# Patient Record
Sex: Male | Born: 2000 | Race: Black or African American | Hispanic: No | Marital: Single | Smoking: Current some day smoker
Health system: Southern US, Community
[De-identification: ages and names within clinical notes are randomized; demographics above are authoritative.]

## PROBLEM LIST (undated history)

## (undated) DIAGNOSIS — B20 Human immunodeficiency virus [HIV] disease: Secondary | ICD-10-CM

## (undated) DIAGNOSIS — J45909 Unspecified asthma, uncomplicated: Secondary | ICD-10-CM

## (undated) DIAGNOSIS — Z21 Asymptomatic human immunodeficiency virus [HIV] infection status: Secondary | ICD-10-CM

## (undated) HISTORY — DX: Human immunodeficiency virus (HIV) disease: B20

## (undated) HISTORY — DX: Asymptomatic human immunodeficiency virus (hiv) infection status: Z21

---

## 2018-03-10 ENCOUNTER — Encounter (HOSPITAL_COMMUNITY): Payer: Self-pay | Admitting: Emergency Medicine

## 2018-03-10 ENCOUNTER — Other Ambulatory Visit: Payer: Self-pay

## 2018-03-10 ENCOUNTER — Emergency Department (HOSPITAL_COMMUNITY): Payer: BLUE CROSS/BLUE SHIELD

## 2018-03-10 ENCOUNTER — Emergency Department (HOSPITAL_COMMUNITY)
Admission: EM | Admit: 2018-03-10 | Discharge: 2018-03-10 | Disposition: A | Discharge status: Home / Self Care | Payer: BLUE CROSS/BLUE SHIELD | Attending: Emergency Medicine | Admitting: Emergency Medicine

## 2018-03-10 DIAGNOSIS — R05 Cough: Secondary | ICD-10-CM | POA: Diagnosis present

## 2018-03-10 DIAGNOSIS — J4521 Mild intermittent asthma with (acute) exacerbation: Secondary | ICD-10-CM | POA: Diagnosis not present

## 2018-03-10 HISTORY — DX: Unspecified asthma, uncomplicated: J45.909

## 2018-03-10 MED ORDER — PREDNISONE 20 MG PO TABS
60.0000 mg | ORAL_TABLET | Freq: Once | ORAL | Status: AC
  Administered 2018-03-10: 60 mg via ORAL
  Filled 2018-03-10: qty 3

## 2018-03-10 MED ORDER — ALBUTEROL SULFATE HFA 108 (90 BASE) MCG/ACT IN AERS: 2.0000 | INHALATION_SPRAY | RESPIRATORY_TRACT | 1 refills | Status: AC | PRN

## 2018-03-10 MED ORDER — AEROCHAMBER PLUS FLO-VU LARGE MISC
Status: AC
  Administered 2018-03-10: 1
  Filled 2018-03-10: qty 1

## 2018-03-10 MED ORDER — AEROCHAMBER PLUS FLO-VU MEDIUM MISC
1.0000 | Freq: Once | Status: AC
  Administered 2018-03-10: 1
  Filled 2018-03-10: qty 1

## 2018-03-10 MED ORDER — ALBUTEROL SULFATE HFA 108 (90 BASE) MCG/ACT IN AERS
2.0000 | INHALATION_SPRAY | Freq: Once | RESPIRATORY_TRACT | Status: AC
  Administered 2018-03-10: 2 via RESPIRATORY_TRACT
  Filled 2018-03-10: qty 6.7

## 2018-03-10 MED ORDER — PREDNISONE 20 MG PO TABS: 40.0000 mg | ORAL_TABLET | Freq: Every day | ORAL | 0 refills | Status: AC

## 2018-03-10 MED ORDER — IPRATROPIUM-ALBUTEROL 0.5-2.5 (3) MG/3ML IN SOLN
3.0000 mL | Freq: Once | RESPIRATORY_TRACT | Status: AC
  Administered 2018-03-10: 3 mL via RESPIRATORY_TRACT
  Filled 2018-03-10: qty 3

## 2018-03-10 NOTE — Discharge Instructions (Signed)
I have given you a prescription for steroids today.  Some common side effects include feelings of extra energy, feeling warm, increased appetite, and stomach upset.  If you are diabetic your sugars may run higher than usual.  Please do not take your next dose of steroids until tomorrow night.

## 2018-03-10 NOTE — ED Notes (Signed)
Patient verbalizes understanding of discharge instructions. Opportunity for questioning and answers were provided. Armband removed by staff, pt discharged from ED. Pt ambulatory to lobby.  

## 2018-03-10 NOTE — ED Triage Notes (Signed)
Pt reports cold since Tuesday with chest congestion, reports he feels like his asthma is flared up but has run out of inhaler.

## 2018-03-10 NOTE — ED Provider Notes (Signed)
[facility] Note   CSN: [id number] Arrival date & time: 03/10/18  1905     History   Chief Complaint Chief Complaint  Patient presents with  . Cough  . Asthma    HPI Logan Jacobs is a 18 y.o. male with a past medical history of asthma who presents today for evaluation of asthma exacerbation.  He reports that on Tuesday he had chest congestion and feels like his asthma flared up.  He had been doing okay until today when he ran out of his inhaler.  He reports that his inhaler is helping his symptoms when he has it.  He denies any fevers.  He has never had to be hospitalized, intubated or placed on BiPAP for his asthma before.    HPI  Past Medical History:  Diagnosis Date  . Asthma     There are no active problems to display for this patient.   History reviewed. No pertinent surgical history.      Home Medications    Prior to Admission medications   Medication Sig Start Date End Date Taking? Authorizing Provider  albuterol (PROVENTIL HFA;VENTOLIN HFA) 108 (90 Base) MCG/ACT inhaler Inhale 2 puffs into the lungs every 4 (four) hours as needed for wheezing or shortness of breath. 03/10/18   Cristina GongHammond, Mickey Esguerra W, PA-C  predniSONE (DELTASONE) 20 MG tablet Take 2 tablets (40 mg total) by mouth daily for 4 days. 03/10/18 03/14/18  Cristina GongHammond, Channie Bostick W, PA-C    Family History History reviewed. No pertinent family history.  Social History Social History   Tobacco Use  . Smoking status: Never Smoker  . Smokeless tobacco: Never Used  Substance Use Topics  . Alcohol use: Never    Frequency: Never  . Drug use: Never     Allergies   Patient has no allergy information on record.   Review of Systems Review of Systems  Constitutional: Negative for fever.  HENT: Negative for congestion.   Respiratory: Positive for cough, shortness of breath and wheezing. Negative for chest tightness.   All other systems reviewed and are  negative.    Physical Exam Updated Vital Signs BP 130/80 (BP Location: Right Arm)   Pulse 87   Temp 98.4 F (36.9 C) (Oral)   Resp (!) 24   SpO2 97%   Physical Exam Vitals signs and nursing note reviewed.  Constitutional:      General: He is not in acute distress.    Appearance: He is well-developed. He is not toxic-appearing or diaphoretic.  HENT:     Head: Normocephalic and atraumatic.     Right Ear: Tympanic membrane normal.     Left Ear: Tympanic membrane normal.     Nose: Nose normal. No congestion.  Eyes:     General: No scleral icterus.       Right eye: No discharge.        Left eye: No discharge.     Conjunctiva/sclera: Conjunctivae normal.  Neck:     Musculoskeletal: Normal range of motion and neck supple. No neck rigidity.  Cardiovascular:     Rate and Rhythm: Normal rate and regular rhythm.     Heart sounds: Normal heart sounds. No murmur.  Pulmonary:     Effort: Pulmonary effort is normal. No respiratory distress.     Breath sounds: No stridor. Wheezing (Diffuse mild wheezes/tightness throughout bilaterally.) present.  Abdominal:     General: There is no distension.  Musculoskeletal:  General: No deformity.  Lymphadenopathy:     Cervical: No cervical adenopathy.  Skin:    General: Skin is warm and dry.  Neurological:     Mental Status: He is alert.     Motor: No abnormal muscle tone.  Psychiatric:        Mood and Affect: Mood normal.        Behavior: Behavior normal.      ED Treatments / Results  Labs (all labs ordered are listed, but only abnormal results are displayed) Labs Reviewed - No data to display  EKG None  Radiology Dg Chest 2 View  Result Date: 03/10/2018 CLINICAL DATA:  Cough, asthma and dyspnea today. EXAM: CHEST - 2 VIEW COMPARISON:  None. FINDINGS: The heart size and mediastinal contours are within normal limits. Both lungs are clear. The visualized skeletal structures are unremarkable. IMPRESSION: No active  cardiopulmonary disease. Electronically Signed   By: Tollie Eth M.D.   On: 03/10/2018 20:27    Procedures Procedures (including critical care time)  Medications Ordered in ED Medications  predniSONE (DELTASONE) tablet 60 mg (has no administration in time range)  albuterol (PROVENTIL HFA;VENTOLIN HFA) 108 (90 Base) MCG/ACT inhaler 2 puff (has no administration in time range)  AEROCHAMBER PLUS FLO-VU MEDIUM MISC 1 each (has no administration in time range)  ipratropium-albuterol (DUONEB) 0.5-2.5 (3) MG/3ML nebulizer solution 3 mL (3 mLs Nebulization Given 03/10/18 2033)     Initial Impression / Assessment and Plan / ED Course  I have reviewed the triage vital signs and the nursing notes.  Pertinent labs & imaging results that were available during my care of the patient were reviewed by me and considered in my medical decision making (see chart for details).  Clinical Course as of Mar 10 2112  Caleen Essex Mar 10, 2018  2108 Patient reevaluated, lungs are clear to auscultation bilaterally and he reports he feels like his breathing is much better.   [EH]    Clinical Course User Index [EH] Cristina Gong, PA-C   Patient presents today for evaluation of asthma exacerbation.  For the past 3 days he has had worsening asthma symptoms.  He ran out of his albuterol inhaler today.  Initially lungs had slight wheezes and decreased air movement bilaterally.  He was treated with a DuoNeb after which he had full resolution of symptoms.  He is given an inhaler and a spacer while in the department today.  He is also given a dose of prednisone.  He is given prescription for additional albuterol inhaler and 4 more days of prednisone given the frequency in which he is using his rescue inhaler.  Return precautions were discussed with patient who states their understanding.  At the time of discharge patient denied any unaddressed complaints or concerns.  Patient is agreeable for discharge home.   Final  Clinical Impressions(s) / ED Diagnoses   Final diagnoses:  Mild intermittent asthma with exacerbation    ED Discharge Orders         Ordered    albuterol (PROVENTIL HFA;VENTOLIN HFA) 108 (90 Base) MCG/ACT inhaler  Every 4 hours PRN     03/10/18 2110    predniSONE (DELTASONE) 20 MG tablet  Daily     03/10/18 2110           Norman Clay 03/10/18 2114    Marily Memos, MD 03/10/18 2252

## 2019-07-06 ENCOUNTER — Ambulatory Visit (HOSPITAL_COMMUNITY)
Admission: EM | Admit: 2019-07-06 | Discharge: 2019-07-06 | Disposition: A | Discharge status: Home / Self Care | Payer: BLUE CROSS/BLUE SHIELD | Attending: Internal Medicine | Admitting: Internal Medicine

## 2019-07-06 ENCOUNTER — Other Ambulatory Visit: Payer: Self-pay

## 2019-07-06 ENCOUNTER — Encounter (HOSPITAL_COMMUNITY): Payer: Self-pay

## 2019-07-06 DIAGNOSIS — Z202 Contact with and (suspected) exposure to infections with a predominantly sexual mode of transmission: Secondary | ICD-10-CM | POA: Diagnosis not present

## 2019-07-06 NOTE — ED Triage Notes (Signed)
Pt wants STI testing, because sexual partner is having penile discharge. Pt denies symptoms at this time.

## 2019-07-06 NOTE — ED Provider Notes (Signed)
[place]    CSN: [id number] Arrival date & time: 07/06/19  1409      History   Chief Complaint Chief Complaint  Patient presents with  . STI testing    HPI Claris Guymon is a 19 y.o. male anal receptive homosexual male comes to the urgent care requesting testing after his sexual partner complained of penile discharge.  Patient has no penile discharge.  No dysuria, urgency or frequency.  Patient denies any rectal ulcers/discharge.  No sore throat.  No fever or chills.   HPI  Past Medical History:  Diagnosis Date  . Asthma     There are no problems to display for this patient.   History reviewed. No pertinent surgical history.     Home Medications    Prior to Admission medications   Medication Sig Start Date End Date Taking? Authorizing Provider  albuterol (PROVENTIL HFA;VENTOLIN HFA) 108 (90 Base) MCG/ACT inhaler Inhale 2 puffs into the lungs every 4 (four) hours as needed for wheezing or shortness of breath. 03/10/18   Lorin Glass, PA-C    Family History Family History  Problem Relation Age of Onset  . Hypertension Mother   . Healthy Father     Social History Social History   Tobacco Use  . Smoking status: Never Smoker  . Smokeless tobacco: Never Used  Substance Use Topics  . Alcohol use: Yes    Comment: occ  . Drug use: Never     Allergies   Patient has no known allergies.   Review of Systems Review of Systems  Gastrointestinal: Negative.   Genitourinary: Negative.   Musculoskeletal: Negative.   Neurological: Negative.      Physical Exam Triage Vital Signs ED Triage Vitals  Enc Vitals Group     BP 07/06/19 1429 (!) 149/85     Pulse Rate 07/06/19 1429 79     Resp 07/06/19 1429 16     Temp 07/06/19 1429 (!) 97.5 F (36.4 C)     Temp Source 07/06/19 1429 Oral     SpO2 07/06/19 1429 100 %     Weight 07/06/19 1431 150 lb (68 kg)     Height 07/06/19 1431 5\' 11"  (1.803 m)     Head Circumference --      Peak Flow  --      Pain Score 07/06/19 1431 0     Pain Loc --      Pain Edu? --      Excl. in [place]? --    No data found.  Updated Vital Signs BP (!) 149/85   Pulse 79   Temp (!) 97.5 F (36.4 C) (Oral)   Resp 16   Ht 5\' 11"  (1.803 m)   Wt 68 kg   SpO2 100%   BMI 20.92 kg/m   Visual Acuity Right Eye Distance:   Left Eye Distance:   Bilateral Distance:    Right Eye Near:   Left Eye Near:    Bilateral Near:     Physical Exam Constitutional:      General: He is not in acute distress.    Appearance: Normal appearance. He is not ill-appearing.  Cardiovascular:     Rate and Rhythm: Normal rate and regular rhythm.  Pulmonary:     Effort: Pulmonary effort is normal.     Breath sounds: Normal breath sounds.  Abdominal:     General: Bowel sounds are normal.     Palpations: Abdomen is soft.  Genitourinary:  Penis: Normal.      Testes: Normal.     Rectum: Normal.  Musculoskeletal:        General: Normal range of motion.  Skin:    General: Skin is warm and dry.  Neurological:     Mental Status: He is alert.      UC Treatments / Results  Labs (all labs ordered are listed, but only abnormal results are displayed) Labs Reviewed  RPR  HIV ANTIBODY (ROUTINE TESTING W REFLEX)  CYTOLOGY, (ORAL, ANAL, URETHRAL) ANCILLARY ONLY  CYTOLOGY, (ORAL, ANAL, URETHRAL) ANCILLARY ONLY    EKG   Radiology No results found.  Procedures Procedures (including critical care time)  Medications Ordered in UC Medications - No data to display  Initial Impression / Assessment and Plan / UC Course  I have reviewed the triage vital signs and the nursing notes.  Pertinent labs & imaging results that were available during my care of the patient were reviewed by me and considered in my medical decision making (see chart for details).     1.  Possible STD exposure: Rectal and penile swabs for GC/chlamydia/trichomonas HIV RPR Safe sex practices We will wait for test results to inform  treatment. Final Clinical Impressions(s) / UC Diagnoses   Final diagnoses:  Exposure to STD   Discharge Instructions   None    ED Prescriptions    None     PDMP not reviewed this encounter.   Merrilee Jansky, MD 07/06/19 [phone]

## 2019-07-07 LAB — HIV ANTIBODY (ROUTINE TESTING W REFLEX): HIV Screen 4th Generation wRfx: REACTIVE — AB

## 2019-07-07 LAB — RPR: RPR Ser Ql: NONREACTIVE

## 2019-07-10 ENCOUNTER — Telehealth: Payer: Self-pay | Admitting: *Deleted

## 2019-07-10 DIAGNOSIS — B20 Human immunodeficiency virus [HIV] disease: Secondary | ICD-10-CM

## 2019-07-10 DIAGNOSIS — Z79899 Other long term (current) drug therapy: Secondary | ICD-10-CM

## 2019-07-10 DIAGNOSIS — Z113 Encounter for screening for infections with a predominantly sexual mode of transmission: Secondary | ICD-10-CM

## 2019-07-10 LAB — HIV-1/2 AB - DIFFERENTIATION
HIV 1 Ab: POSITIVE — AB
HIV 2 Ab: NEGATIVE

## 2019-07-10 NOTE — Telephone Encounter (Signed)
Referral sent to [facility]. Andree Coss, RN

## 2019-07-10 NOTE — Telephone Encounter (Signed)
-----   Message from Veryl Speak, FNP sent at 07/10/2019  3:50 PM EDT ----- Regarding: New HIV Newly diagnosed HIV-1 confirmed. Please notify [facility]. Not sure he is aware of diagnosis.

## 2019-07-11 ENCOUNTER — Telehealth (HOSPITAL_COMMUNITY): Payer: Self-pay

## 2019-07-11 LAB — CYTOLOGY, (ORAL, ANAL, URETHRAL) ANCILLARY ONLY
Chlamydia: NEGATIVE
Chlamydia: NEGATIVE
Comment: NEGATIVE
Comment: NEGATIVE
Comment: NEGATIVE
Comment: NEGATIVE
Comment: NORMAL
Comment: NORMAL
Neisseria Gonorrhea: NEGATIVE
Neisseria Gonorrhea: NEGATIVE
Trichomonas: NEGATIVE
Trichomonas: NEGATIVE

## 2019-07-11 NOTE — Addendum Note (Signed)
Addended by: Andree Coss on: 07/11/2019 04:39 PM   Modules accepted: Orders

## 2019-07-11 NOTE — Telephone Encounter (Signed)
Selena Batten at [facility] department received call from patient. He found his positive test result on mychart, and contacted the health department himself for connection to care.  Per Selena Batten, patient is ready to start at [facility]. Andree Coss, RN

## 2019-07-12 ENCOUNTER — Ambulatory Visit: Payer: Self-pay

## 2019-07-12 ENCOUNTER — Other Ambulatory Visit: Payer: Self-pay

## 2019-07-12 ENCOUNTER — Telehealth: Payer: Self-pay | Admitting: Pharmacy Technician

## 2019-07-12 DIAGNOSIS — Z79899 Other long term (current) drug therapy: Secondary | ICD-10-CM

## 2019-07-12 DIAGNOSIS — B20 Human immunodeficiency virus [HIV] disease: Secondary | ICD-10-CM

## 2019-07-12 DIAGNOSIS — Z113 Encounter for screening for infections with a predominantly sexual mode of transmission: Secondary | ICD-10-CM

## 2019-07-12 NOTE — Telephone Encounter (Addendum)
[facility] Patient Advocate Encounter   Patient has been approved until 07/11/2020 for [employer] Advancing Access Patient Assistance Program. He is enrolled adap eligible and will need a denial letter within 90-days of 07/14/2019 to remain in the program.   I have spoken with the patient and they will address pharmacy coverage on his 07/31/2019 appointment with Tammy Sours and can use this voucher at that time. Currently a college student and not under his own insurance.   The billing information is:   Patient knows to call the office with questions or concerns. [employer] application has been sent 06/03 and will more than likely get denied for income and household size. He prefers pickup from [employer].  Beulah Gandy, CPhT Specialty Pharmacy Patient [facility] for Infectious Disease Phone: [phone] Fax: [phone] 07/12/2019 2:30 PM

## 2019-07-13 ENCOUNTER — Encounter: Payer: Self-pay | Admitting: Family

## 2019-07-13 LAB — URINALYSIS
Bilirubin Urine: NEGATIVE
Glucose, UA: NEGATIVE
Hgb urine dipstick: NEGATIVE
Ketones, ur: NEGATIVE
Leukocytes,Ua: NEGATIVE
Nitrite: NEGATIVE
Protein, ur: NEGATIVE
Specific Gravity, Urine: 1.022 (ref 1.001–1.03)
pH: 7 (ref 5.0–8.0)

## 2019-07-13 LAB — URINE CYTOLOGY ANCILLARY ONLY
Chlamydia: NEGATIVE
Comment: NEGATIVE
Comment: NORMAL
Neisseria Gonorrhea: NEGATIVE

## 2019-07-13 LAB — T-HELPER CELL (CD4) - (RCID CLINIC ONLY)
CD4 % Helper T Cell: 29 % — ABNORMAL LOW (ref 33–65)
CD4 T Cell Abs: 626 /uL (ref 400–1790)

## 2019-07-24 LAB — HEPATITIS B SURFACE ANTIGEN: Hepatitis B Surface Ag: NONREACTIVE

## 2019-07-24 LAB — COMPLETE METABOLIC PANEL WITH GFR
AG Ratio: 1.6 (calc) (ref 1.0–2.5)
ALT: 23 U/L (ref 9–46)
AST: 30 U/L (ref 10–40)
Albumin: 4.5 g/dL (ref 3.6–5.1)
Alkaline phosphatase (APISO): 66 U/L (ref 36–130)
BUN: 16 mg/dL (ref 7–25)
CO2: 25 mmol/L (ref 20–32)
Calcium: 10.2 mg/dL (ref 8.6–10.3)
Chloride: 105 mmol/L (ref 98–110)
Creat: 0.9 mg/dL (ref 0.60–1.35)
GFR, Est African American: 142 mL/min/{1.73_m2} (ref >=60)
GFR, Est Non African American: 123 mL/min/{1.73_m2} (ref >=60)
Globulin: 2.9 g/dL (calc) (ref 1.9–3.7)
Glucose, Bld: 83 mg/dL (ref 65–99)
Potassium: 4 mmol/L (ref 3.5–5.3)
Sodium: 140 mmol/L (ref 135–146)
Total Bilirubin: 0.6 mg/dL (ref 0.2–1.2)
Total Protein: 7.4 g/dL (ref 6.1–8.1)

## 2019-07-24 LAB — CBC WITH DIFFERENTIAL/PLATELET
Absolute Monocytes: 442 cells/uL (ref 200–950)
Basophils Absolute: 19 cells/uL (ref 0–200)
Basophils Relative: 0.4 %
Eosinophils Absolute: 141 cells/uL (ref 15–500)
Eosinophils Relative: 3 %
HCT: 41.1 % (ref 38.5–50.0)
Hemoglobin: 14.3 g/dL (ref 13.2–17.1)
Lymphs Abs: 2143 cells/uL (ref 850–3900)
MCH: 31.2 pg (ref 27.0–33.0)
MCHC: 34.8 g/dL (ref 32.0–36.0)
MCV: 89.7 fL (ref 80.0–100.0)
MPV: 11 fL (ref 7.5–12.5)
Monocytes Relative: 9.4 %
Neutro Abs: 1955 cells/uL (ref 1500–7800)
Neutrophils Relative %: 41.6 %
Platelets: 230 10*3/uL (ref 140–400)
RBC: 4.58 10*6/uL (ref 4.20–5.80)
RDW: 13.1 % (ref 11.0–15.0)
Total Lymphocyte: 45.6 %
WBC: 4.7 10*3/uL (ref 3.8–10.8)

## 2019-07-24 LAB — LIPID PANEL
Cholesterol: 134 mg/dL (ref <200)
HDL: 72 mg/dL (ref >=40)
LDL Cholesterol (Calc): 44 mg/dL (calc)
Non-HDL Cholesterol (Calc): 62 mg/dL (calc) (ref <130)
Total CHOL/HDL Ratio: 1.9 (calc) (ref <5.0)
Triglycerides: 101 mg/dL (ref <150)

## 2019-07-24 LAB — HEPATITIS A ANTIBODY, TOTAL: Hepatitis A AB,Total: REACTIVE — AB

## 2019-07-24 LAB — QUANTIFERON-TB GOLD PLUS
Mitogen-NIL: 9.95 IU/mL
NIL: 0.07 IU/mL
QuantiFERON-TB Gold Plus: NEGATIVE
TB1-NIL: 0 IU/mL
TB2-NIL: 0.01 IU/mL

## 2019-07-24 LAB — HIV ANTIBODY (ROUTINE TESTING W REFLEX): HIV 1&2 Ab, 4th Generation: REACTIVE — AB

## 2019-07-24 LAB — RPR: RPR Ser Ql: NONREACTIVE

## 2019-07-24 LAB — HEPATITIS C ANTIBODY
Hepatitis C Ab: NONREACTIVE
SIGNAL TO CUT-OFF: 0.03 (ref <1.00)

## 2019-07-24 LAB — HIV-1 RNA ULTRAQUANT REFLEX TO GENTYP+
HIV 1 RNA Quant: 642000 copies/mL — ABNORMAL HIGH
HIV-1 RNA Quant, Log: 5.81 Log copies/mL — ABNORMAL HIGH

## 2019-07-24 LAB — HLA B*5701: HLA-B*5701 w/rflx HLA-B High: NEGATIVE

## 2019-07-24 LAB — HIV-1 GENOTYPE: HIV-1 Genotype: DETECTED — AB

## 2019-07-24 LAB — HEPATITIS B CORE ANTIBODY, TOTAL: Hep B Core Total Ab: NONREACTIVE

## 2019-07-24 LAB — HIV-1/2 AB - DIFFERENTIATION
HIV-1 antibody: POSITIVE — AB
HIV-2 Ab: NEGATIVE

## 2019-07-24 LAB — HEPATITIS B SURFACE ANTIBODY,QUALITATIVE: Hep B S Ab: NONREACTIVE

## 2019-07-27 ENCOUNTER — Telehealth: Payer: Self-pay | Admitting: Pharmacy Technician

## 2019-07-27 NOTE — Telephone Encounter (Addendum)
[facility] Patient Product/process development scientist completed.    The patient is insured through [facility] and has a $3600 copay.  Patient does not want parents to know he is HIV positive and does not want medication billed through their insurance.  I have ADAP denial letter to appeal to patient assistance.  Netty Starring. Dimas Aguas CPhT Specialty Pharmacy Patient [facility] for Infectious Disease Phone: [phone] Fax:  [phone]

## 2019-07-31 ENCOUNTER — Ambulatory Visit (INDEPENDENT_AMBULATORY_CARE_PROVIDER_SITE_OTHER): Payer: Self-pay | Admitting: Family

## 2019-07-31 ENCOUNTER — Encounter: Payer: Self-pay | Admitting: Family

## 2019-07-31 ENCOUNTER — Other Ambulatory Visit: Payer: Self-pay

## 2019-07-31 ENCOUNTER — Ambulatory Visit (INDEPENDENT_AMBULATORY_CARE_PROVIDER_SITE_OTHER): Payer: Self-pay | Admitting: Pharmacist

## 2019-07-31 VITALS — BP 170/95 | HR 63 | Temp 97.6°F | Wt 152.0 lb

## 2019-07-31 DIAGNOSIS — B20 Human immunodeficiency virus [HIV] disease: Secondary | ICD-10-CM

## 2019-07-31 MED ORDER — BICTEGRAVIR-EMTRICITAB-TENOFOV 50-200-25 MG PO TABS: 1.0000 | ORAL_TABLET | Freq: Every day | ORAL | 2 refills | Status: DC

## 2019-07-31 NOTE — Telephone Encounter (Signed)
Needed ADAP denial letter has been faxed.  Netty Starring. Dimas Aguas CPhT Specialty Pharmacy Patient [facility] for Infectious Disease Phone: [phone] Fax:  [phone]

## 2019-07-31 NOTE — Progress Notes (Signed)
HPI: Logan Jacobs is a 19 y.o. male who presents to the [facility] clinic today to initiate care for a newly diagnosed HIV infection.  There are no problems to display for this patient.   Patient's Medications  New Prescriptions   No medications on file  Previous Medications   ALBUTEROL (PROVENTIL HFA;VENTOLIN HFA) 108 (90 BASE) MCG/ACT INHALER    Inhale 2 puffs into the lungs every 4 (four) hours as needed for wheezing or shortness of breath.  Modified Medications   No medications on file  Discontinued Medications   No medications on file    Allergies: No Known Allergies  Past Medical History: Past Medical History:  Diagnosis Date  . Asthma   . HIV (human immunodeficiency virus infection) (HCC)     Social History: Social History   Socioeconomic History  . Marital status: Significant Other    Spouse name: Not on file  . Number of children: 0  . Years of education: 86  . Highest education level: Not on file  Occupational History  . Not on file  Tobacco Use  . Smoking status: Never Smoker  . Smokeless tobacco: Never Used  Vaping Use  . Vaping Use: Never used  Substance and Sexual Activity  . Alcohol use: Yes    Comment: occ  . Drug use: Never  . Sexual activity: Not on file  Other Topics Concern  . Not on file  Social History Narrative  . Not on file   Social Determinants of Health   Financial Resource Strain:   . Difficulty of Paying Living Expenses:   Food Insecurity:   . Worried About Programme researcher, broadcasting/film/video in the Last Year:   . Barista in the Last Year:   Transportation Needs:   . Freight forwarder (Medical):   Marland Kitchen Lack of Transportation (Non-Medical):   Physical Activity:   . Days of Exercise per Week:   . Minutes of Exercise per Session:   Stress:   . Feeling of Stress :   Social Connections:   . Frequency of Communication with Friends and Family:   . Frequency of Social Gatherings with Friends and Family:   . Attends Religious  Services:   . Active Member of Clubs or Organizations:   . Attends Banker Meetings:   Marland Kitchen Marital Status:     Labs: Lab Results  Component Value Date   HIV1RNAQUANT 642,000 (H) 07/12/2019   CD4TABS 626 07/12/2019    RPR and STI Lab Results  Component Value Date   LABRPR NON-REACTIVE 07/12/2019   LABRPR NON REACTIVE 07/06/2019    STI Results GC CT  07/12/2019 Negative Negative  07/06/2019 Negative Negative  07/06/2019 Negative Negative    Hepatitis B Lab Results  Component Value Date   HEPBSAB NON-REACTIVE 07/12/2019   HEPBSAG NON-REACTIVE 07/12/2019   HEPBCAB NON-REACTIVE 07/12/2019   Hepatitis C Lab Results  Component Value Date   HEPCAB NON-REACTIVE 07/12/2019   Hepatitis A Lab Results  Component Value Date   HAV REACTIVE (A) 07/12/2019   Lipids: Lab Results  Component Value Date   CHOL 134 07/12/2019   TRIG 101 07/12/2019   HDL 72 07/12/2019   CHOLHDL 1.9 07/12/2019   LDLCALC 44 07/12/2019    Current HIV Regimen: Treatment naive  Assessment: Logan Jacobs is here today to initiate care with Tammy Sours for his newly diagnosed HIV infection.  Logan Jacobs is treatment naive with an initial HIV viral load of 642,000 and a CD4 count of  626.  There were a few resistance mutations found on initial genotype, but none that are concerning or confer any resistance to any medications. Will start patient on [place].  I spoke with [place] about [place] before he starts this medication. This is a one pill once a day medication that can be taken with or without food. It is generally very well tolerated, some reported side effects include headache, nausea, diarrhea or dizziness but these typically resolve quickly if they are experienced. He does not take any multivitamins or medications that are concerning for interaction with Biktarvy. Lastly, we spoke about the importance of adherence and that it is the most important factor in treating his HIV infection effectively  which he understands.  He is under his parents insurance but does not wish to use this because he does not want his parents to know he has HIV. We are trying to get him ADAP approved but in the meantime we will get him medication through advancing access and I provided him with the information for the pharmacy. I also provided him with Cassie's card and he will call with any medication related issues/questions.  Plan: - Start [place] - Follow up with Gershon Crane, PharmD PGY2 Infectious Disease Pharmacy Resident  [facility] Infect[facility] Disease 07/31/2019, 9:38 AM

## 2019-07-31 NOTE — Assessment & Plan Note (Signed)
Logan Jacobs is a 18-year-old male with newly diagnosed HIV-1 disease with initial viral load of 642,000 and CD4 count of 626.  Genotype detected with no significant resistance patterns consistent with wild-type virus.  Risk factors for acquiring HIV is sexual contact through MSM.  Does not recall any symptoms associated with acute retroviral syndrome and no history of opportunistic infection.  We reviewed lab work and discussed the plan of care.  Also discussed the pathogenesis, transmission, prevention, risks if left untreated, and treatment options for HIV.  He met with pharmacy staff today to review medication.  Plan to start Biktarvy 1 tablet daily.  Follow-up in 1 month or sooner if needed with lab work on the same day. 

## 2019-07-31 NOTE — Progress Notes (Signed)
Subjective:    Patient ID: Logan Jacobs, male    DOB: January 10, 2001, 19 y.o.   MRN: [id number]  Chief Complaint  Patient presents with  . Follow-up    HPI:  Logan Jacobs is a 19 y.o. male with no significant previous medical history presenting today for initial evaluation and treatment of newly diagnosed HIV-1 disease.  Logan Jacobs was recently diagnosed with HIV-1 disease on 07/06/2019 when seen in urgent care for concerns of STI and noted to have positive HIV testing. Risk factors for acquiring HIV includes sexual contact through MSM. Does not recall any flulike symptoms or fevers/chills. Has had a rash on his back initially which is slowly improving. He is currently in a relationship with a partner who is HIV negative. 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  Logan Jacobs initial clinic blood work completed on 07/12/2019 with viral load of 642,000 and CD4 count of 626. Genotype detected with no significant resistance patterns present indicating wild-type virus. HLA B5701 and QuantiFERON gold negative. STI testing was negative for gonorrhea, chlamydia, and syphilis.  Logan Jacobs will have financial assistance through advancing access.  Denies feelings of being down, depressed, or hopeless recently.  No recreational or illicit drug use or tobacco use.  He does drink alcohol on occasion.    No Known Allergies    Outpatient Medications Prior to Visit  Medication Sig Dispense Refill  . albuterol (PROVENTIL HFA;VENTOLIN HFA) 108 (90 Base) MCG/ACT inhaler Inhale 2 puffs into the lungs every 4 (four) hours as needed for wheezing or shortness of breath. (Patient not taking: Reported on 07/31/2019) 1 Inhaler 1   No facility-administered medications prior to visit.     Past Medical History:  Diagnosis Date  . Asthma   . HIV (human immunodeficiency virus infection) ([place])       History  reviewed. No pertinent surgical history.    Family History  Problem Relation Age of Onset  . Hypertension Mother   . Healthy Father       Social History   Socioeconomic History  . Marital status: Significant Other    Spouse name: Not on file  . Number of children: 0  . Years of education: 84  . Highest education level: Not on file  Occupational History  . Not on file  Tobacco Use  . Smoking status: Never Smoker  . Smokeless tobacco: Never Used  Vaping Use  . Vaping Use: Never used  Substance and Sexual Activity  . Alcohol use: Yes    Comment: occ  . Drug use: Never  . Sexual activity: Not on file  Other Topics Concern  . Not on file  Social History Narrative  . Not on file   Social Determinants of Health   Financial Resource Strain:   . Difficulty of Paying Living Expenses:   Food Insecurity:   . Worried About Charity fundraiser in the Last Year:   . Arboriculturist in the Last Year:   Transportation Needs:   . Film/video editor (Medical):   Marland Kitchen Lack of Transportation (Non-Medical):   Physical Activity:   . Days of Exercise per Week:   . Minutes of Exercise per Session:   Stress:   . Feeling of Stress :   Social Connections:   . Frequency of Communication with Friends and Family:   . Frequency of Social Gatherings with Friends and Family:   .  Attends Religious Services:   . Active Member of Clubs or Organizations:   . Attends Archivist Meetings:   Marland Kitchen Marital Status:   Intimate Partner Violence:   . Fear of Current or Ex-Partner:   . Emotionally Abused:   Marland Kitchen Physically Abused:   . Sexually Abused:       Review of Systems  Constitutional: Negative for appetite change, chills, fatigue, fever and unexpected weight change.  Eyes: Negative for visual disturbance.  Respiratory: Negative for cough, chest tightness, shortness of breath and wheezing.   Cardiovascular: Negative for chest pain and leg swelling.  Gastrointestinal: Negative for  abdominal pain, constipation, diarrhea, nausea and vomiting.  Genitourinary: Negative for dysuria, flank pain, frequency, genital sores, hematuria and urgency.  Skin: Negative for rash.  Allergic/Immunologic: Negative for immunocompromised state.  Neurological: Negative for dizziness and headaches.       Objective:    BP (!) 170/95   Pulse 63   Temp 97.6 F (36.4 C) (Oral)   Wt 152 lb (68.9 kg)   BMI 21.20 kg/m  Nursing note and vital signs reviewed.  Physical Exam Constitutional:      General: He is not in acute distress.    Appearance: He is well-developed.  Eyes:     Conjunctiva/sclera: Conjunctivae normal.  Cardiovascular:     Rate and Rhythm: Normal rate and regular rhythm.     Heart sounds: Normal heart sounds. No murmur heard.  No friction rub. No gallop.   Pulmonary:     Effort: Pulmonary effort is normal. No respiratory distress.     Breath sounds: Normal breath sounds. No wheezing or rales.  Chest:     Chest wall: No tenderness.  Abdominal:     General: Bowel sounds are normal.     Palpations: Abdomen is soft.     Tenderness: There is no abdominal tenderness.  Musculoskeletal:     Cervical back: Neck supple.  Lymphadenopathy:     Cervical: No cervical adenopathy.  Skin:    General: Skin is warm and dry.     Findings: No rash.  Neurological:     Mental Status: He is alert and oriented to person, place, and time.  Psychiatric:        Behavior: Behavior normal.        Thought Content: Thought content normal.        Judgment: Judgment normal.         Assessment & Plan:   Patient Active Problem List   Diagnosis Date Noted  . Human immunodeficiency virus (HIV) disease ([place]) 07/31/2019     Problem List Items Addressed This Visit      Other   Human immunodeficiency virus (HIV) disease ([place]) - Primary    Logan Jacobs is a 19 year old male with newly diagnosed HIV-1 disease with initial viral load of 642,000 and CD4 count of 626.  Genotype detected  with no significant resistance patterns consistent with wild-type virus.  Risk factors for acquiring HIV is sexual contact through MSM.  Does not recall any symptoms associated with acute retroviral syndrome and no history of opportunistic infection.  We reviewed lab work and discussed the plan of care.  Also discussed the pathogenesis, transmission, prevention, risks if left untreated, and treatment options for HIV.  He met with pharmacy staff today to review medication.  Plan to start Biktarvy 1 tablet daily.  Follow-up in 1 month or sooner if needed with lab work on the same day.  Relevant Medications   bictegravir-emtricitabine-tenofovir AF (BIKTARVY) 50-200-25 MG TABS tablet       I am having Oda Mathers start on bictegravir-emtricitabine-tenofovir AF. I am also having him maintain his albuterol.   Meds ordered this encounter  Medications  . bictegravir-emtricitabine-tenofovir AF (BIKTARVY) 50-200-25 MG TABS tablet    Sig: Take 1 tablet by mouth daily.    Dispense:  30 tablet    Refill:  2    Order Specific Question:   Supervising Provider    Answer:   Carlyle Basques [[id number]]     Follow-up: Return in about 1 month (around 08/30/2019), or if symptoms worsen or fail to improve.    Terri Piedra, MSN, FNP-C Nurse Practitioner [facility] [place] number: [phone]

## 2019-07-31 NOTE — Patient Instructions (Signed)
Nice to see you.  We will get you started on Biktarvy.   Let us know if you have any questions.   We will plan to follow up in 1 month or sooner if needed with lab work on the same day.  Have a great day and stay safe!

## 2019-08-29 ENCOUNTER — Other Ambulatory Visit: Payer: Self-pay

## 2019-08-29 ENCOUNTER — Ambulatory Visit (INDEPENDENT_AMBULATORY_CARE_PROVIDER_SITE_OTHER): Payer: BLUE CROSS/BLUE SHIELD | Admitting: Family

## 2019-08-29 ENCOUNTER — Encounter: Payer: Self-pay | Admitting: Family

## 2019-08-29 VITALS — BP 142/76 | HR 66 | Temp 97.7°F | Wt 154.0 lb

## 2019-08-29 DIAGNOSIS — B20 Human immunodeficiency virus [HIV] disease: Secondary | ICD-10-CM

## 2019-08-29 MED ORDER — BICTEGRAVIR-EMTRICITAB-TENOFOV 50-200-25 MG PO TABS: 1.0000 | ORAL_TABLET | Freq: Every day | ORAL | 2 refills | Status: DC

## 2019-08-29 NOTE — Patient Instructions (Signed)
Nice to see you.  We will check your lab work today.  Continue take your [place] daily as prescribed.  Refills have been sent to the pharmacy.  Plan for follow-up in 6 weeks or sooner if needed with lab work 1 to 2 weeks prior to appointment or on same day.  Have a great day and stay safe!

## 2019-08-29 NOTE — Progress Notes (Signed)
Subjective:    Patient ID: Logan Jacobs, male    DOB: Aug 25, 2000, 19 y.o.   MRN: [id number]  Chief Complaint  Patient presents with  . Follow-up    B20     HPI:  Logan Jacobs is a 19 y.o. male with HIV disease who was last seen in the office on 07/31/2019 with newly diagnosed HIV disease with initial CD4 count of 626 and a viral load of 642,000.  He was started on Biktarvy.  Here today for routine follow-up.  Logan Jacobs has been taking his Biktarvy daily as prescribed and has noticed a small acne type breakout that comes and goes that is new since starting the [place]. Overall feeling well with no new concerns/complaints. Denies fevers, chills, night sweats, headaches, changes in vision, neck pain/stiffness, nausea, diarrhea, vomiting, and lesions.  Logan Jacobs has no problems obtaining his medication from the pharmacy and remains covered through [facility].  No feelings of being down, depressed, or hopeless recently.  Smokes black and milds on occasion with no recreational or illicit drug use and alcohol consumption socially.  Declines condoms today.  Interested in receiving the Covid vaccination.   No Known Allergies    Outpatient Medications Prior to Visit  Medication Sig Dispense Refill  . albuterol (PROVENTIL HFA;VENTOLIN HFA) 108 (90 Base) MCG/ACT inhaler Inhale 2 puffs into the lungs every 4 (four) hours as needed for wheezing or shortness of breath. 1 Inhaler 1  . bictegravir-emtricitabine-tenofovir AF (BIKTARVY) 50-200-25 MG TABS tablet Take 1 tablet by mouth daily. 30 tablet 2   No facility-administered medications prior to visit.     Past Medical History:  Diagnosis Date  . Asthma   . HIV (human immunodeficiency virus infection) ([facility])      History reviewed. No pertinent surgical history.     Review of Systems  Constitutional: Negative for appetite change, chills, fatigue, fever and unexpected weight change.  Eyes: Negative for visual  disturbance.  Respiratory: Negative for cough, chest tightness, shortness of breath and wheezing.   Cardiovascular: Negative for chest pain and leg swelling.  Gastrointestinal: Negative for abdominal pain, constipation, diarrhea, nausea and vomiting.  Genitourinary: Negative for dysuria, flank pain, frequency, genital sores, hematuria and urgency.  Skin: Negative for rash.  Allergic/Immunologic: Negative for immunocompromised state.  Neurological: Negative for dizziness and headaches.      Objective:    BP (!) 142/76   Pulse 66   Temp 97.7 F (36.5 C) (Oral)   Wt 154 lb (69.9 kg)   BMI 21.48 kg/m  Nursing note and vital signs reviewed.  Physical Exam Constitutional:      General: He is not in acute distress.    Appearance: He is well-developed.  Eyes:     Conjunctiva/sclera: Conjunctivae normal.  Cardiovascular:     Rate and Rhythm: Normal rate and regular rhythm.     Heart sounds: Normal heart sounds. No murmur heard.  No friction rub. No gallop.   Pulmonary:     Effort: Pulmonary effort is normal. No respiratory distress.     Breath sounds: Normal breath sounds. No wheezing or rales.  Chest:     Chest wall: No tenderness.  Abdominal:     General: Bowel sounds are normal.     Palpations: Abdomen is soft.     Tenderness: There is no abdominal tenderness.  Musculoskeletal:     Cervical back: Neck supple.  Lymphadenopathy:     Cervical: No cervical adenopathy.  Skin:    General:  Skin is warm and dry.     Findings: No rash.     Comments: Dry patches of skin noted on forehead.   Neurological:     Mental Status: He is alert and oriented to person, place, and time.  Psychiatric:        Behavior: Behavior normal.        Thought Content: Thought content normal.        Judgment: Judgment normal.      Depression screen PHQ 2/9 08/29/2019  Decreased Interest 0  Down, Depressed, Hopeless 0  PHQ - 2 Score 0       Assessment & Plan:    Patient Active Problem List     Diagnosis Date Noted  . Human immunodeficiency virus (HIV) disease ([facility]) 07/31/2019     Problem List Items Addressed This Visit      Other   Human immunodeficiency virus (HIV) disease ([facility]) - Primary    Logan Jacobs appears to be doing well following his first month of Biktarvy with good adherence and tolerance to his ART regimen.  No signs/symptoms of opportunistic infection or progressive HIV disease.  Rash does not appear to be related to [employer].  We reviewed his lab work and discussed the plan of care.  Discussed U equals U.  Check blood work today.  Continue current dose of Biktarvy.  Plan for follow-up in 6 weeks or sooner if needed with lab work 1 to 2 weeks prior to appointment or on same day.      Relevant Medications   bictegravir-emtricitabine-tenofovir AF (BIKTARVY) 50-200-25 MG TABS tablet   Other Relevant Orders   COMPLETE METABOLIC PANEL WITH GFR   HIV-1 RNA quant-no reflex-bld   T-helper cell (CD4)- ([facility] clinic only)       I am having Logan Jacobs maintain his albuterol and bictegravir-emtricitabine-tenofovir AF.   Meds ordered this encounter  Medications  . bictegravir-emtricitabine-tenofovir AF (BIKTARVY) 50-200-25 MG TABS tablet    Sig: Take 1 tablet by mouth daily.    Dispense:  30 tablet    Refill:  2    Order Specific Question:   Supervising Provider    Answer:   Judyann Munson [[id number]]     Follow-up: Return in about 6 weeks (around 10/10/2019), or if symptoms worsen or fail to improve.   Marcos Eke, MSN, FNP-C Nurse Practitioner [facility] Main number: [phone]

## 2019-08-29 NOTE — Assessment & Plan Note (Signed)
Mr. Logan Jacobs appears to be doing well following his first month of Biktarvy with good adherence and tolerance to his ART regimen.  No signs/symptoms of opportunistic infection or progressive HIV disease.  Rash does not appear to be related to [employer].  We reviewed his lab work and discussed the plan of care.  Discussed U equals U.  Check blood work today.  Continue current dose of Biktarvy.  Plan for follow-up in 6 weeks or sooner if needed with lab work 1 to 2 weeks prior to appointment or on same day.

## 2019-08-30 LAB — T-HELPER CELL (CD4) - (RCID CLINIC ONLY)
CD4 % Helper T Cell: 32 % — ABNORMAL LOW (ref 33–65)
CD4 T Cell Abs: 841 /uL (ref 400–1790)

## 2019-08-31 LAB — COMPLETE METABOLIC PANEL WITH GFR
AG Ratio: 1.9 (calc) (ref 1.0–2.5)
ALT: 17 U/L (ref 9–46)
AST: 24 U/L (ref 10–40)
Albumin: 4.6 g/dL (ref 3.6–5.1)
Alkaline phosphatase (APISO): 63 U/L (ref 36–130)
BUN: 10 mg/dL (ref 7–25)
CO2: 28 mmol/L (ref 20–32)
Calcium: 10 mg/dL (ref 8.6–10.3)
Chloride: 105 mmol/L (ref 98–110)
Creat: 1.19 mg/dL (ref 0.60–1.35)
GFR, Est African American: 101 mL/min/{1.73_m2} (ref >=60)
GFR, Est Non African American: 87 mL/min/{1.73_m2} (ref >=60)
Globulin: 2.4 g/dL (calc) (ref 1.9–3.7)
Glucose, Bld: 77 mg/dL (ref 65–99)
Potassium: 4.5 mmol/L (ref 3.5–5.3)
Sodium: 142 mmol/L (ref 135–146)
Total Bilirubin: 0.6 mg/dL (ref 0.2–1.2)
Total Protein: 7 g/dL (ref 6.1–8.1)

## 2019-08-31 LAB — HIV-1 RNA QUANT-NO REFLEX-BLD
HIV 1 RNA Quant: 67 copies/mL — ABNORMAL HIGH
HIV-1 RNA Quant, Log: 1.83 Log copies/mL — ABNORMAL HIGH

## 2019-10-16 ENCOUNTER — Encounter: Payer: Self-pay | Admitting: Family

## 2019-10-16 ENCOUNTER — Ambulatory Visit (INDEPENDENT_AMBULATORY_CARE_PROVIDER_SITE_OTHER): Payer: BLUE CROSS/BLUE SHIELD | Admitting: Family

## 2019-10-16 ENCOUNTER — Other Ambulatory Visit: Payer: Self-pay

## 2019-10-16 VITALS — BP 137/74 | HR 75 | Wt 158.0 lb

## 2019-10-16 DIAGNOSIS — B20 Human immunodeficiency virus [HIV] disease: Secondary | ICD-10-CM | POA: Diagnosis not present

## 2019-10-16 DIAGNOSIS — Z113 Encounter for screening for infections with a predominantly sexual mode of transmission: Secondary | ICD-10-CM

## 2019-10-16 DIAGNOSIS — Z0000 Encounter for general adult medical examination without abnormal findings: Secondary | ICD-10-CM | POA: Diagnosis not present

## 2019-10-16 MED ORDER — BICTEGRAVIR-EMTRICITAB-TENOFOV 50-200-25 MG PO TABS: 1.0000 | ORAL_TABLET | Freq: Every day | ORAL | 2 refills | Status: DC

## 2019-10-16 NOTE — Patient Instructions (Signed)
Nice to see you.  We will check your lab work today.  Continue to take your [place] daily as prescribed.  Refills have been sent to the pharmacy.  Plan for follow up in 2 months or sooner if needed.   Have a great day and stay safe!

## 2019-10-16 NOTE — Assessment & Plan Note (Signed)
Mr. Schellhase continues to have well-controlled HIV disease with good adherence and tolerance to his ART regimen of Biktarvy.  No signs/symptoms of opportunistic infection or progressive HIV disease.  Reviewed previous lab work and discussed the plan of care.  Continue current dose of Biktarvy.  Check blood work today.  Plan for follow-up in 2 months or sooner if needed with lab work on the same day.

## 2019-10-16 NOTE — Assessment & Plan Note (Addendum)
   Declines vaccinations today.  Discussed/encouraged Covid vaccine.  Discussed importance of safe sexual practice to reduce risk of STI.  Condoms declined.  STI testing today  Recommended routine dental care with primary dental insurance.

## 2019-10-16 NOTE — Progress Notes (Signed)
Subjective:    Patient ID: Logan Jacobs, male    DOB: April 02, 2000, 19 y.o.   MRN: [id number]  Chief Complaint  Patient presents with  . Follow-up    No questions or concerns     HPI:  Logan Jacobs is a 19 y.o. male with HIV disease with risk factors including MSM who was last seen in the office on 08/29/2019 with good adherence and tolerance to his ART regimen of Biktarvy.  Viral load at the time was found to be 67 with CD4 count of 841.  Here today for routine follow-up.  Logan Jacobs has been taking his Biktarvy daily as prescribed with no adverse side effects or missed doses.  Overall feeling well today with no new concerns/complaints. Denies fevers, chills, night sweats, headaches, changes in vision, neck pain/stiffness, nausea, diarrhea, vomiting, lesions or rashes.  Logan Jacobs has no problems obtaining his medication from the pharmacy remains covered through [facility].  Denies feelings of being down, depressed, or hopeless at times.  No current recreational or illicit drug use.  Light tobacco smoker and occasional alcohol consumption.  Would like STI testing today.  Thinking about Covid vaccination.  Due for routine dental care.  Continues to work full-time at [employer].  No Known Allergies    Outpatient Medications Prior to Visit  Medication Sig Dispense Refill  . albuterol (PROVENTIL HFA;VENTOLIN HFA) 108 (90 Base) MCG/ACT inhaler Inhale 2 puffs into the lungs every 4 (four) hours as needed for wheezing or shortness of breath. 1 Inhaler 1  . bictegravir-emtricitabine-tenofovir AF (BIKTARVY) 50-200-25 MG TABS tablet Take 1 tablet by mouth daily. 30 tablet 2   No facility-administered medications prior to visit.     Past Medical History:  Diagnosis Date  . Asthma   . HIV (human immunodeficiency virus infection) ([facility])      History reviewed. No pertinent surgical history.     Review of Systems  Constitutional: Negative for appetite change, chills, fatigue,  fever and unexpected weight change.  Eyes: Negative for visual disturbance.  Respiratory: Negative for cough, chest tightness, shortness of breath and wheezing.   Cardiovascular: Negative for chest pain and leg swelling.  Gastrointestinal: Negative for abdominal pain, constipation, diarrhea, nausea and vomiting.  Genitourinary: Negative for dysuria, flank pain, frequency, genital sores, hematuria and urgency.  Skin: Negative for rash.  Allergic/Immunologic: Negative for immunocompromised state.  Neurological: Negative for dizziness and headaches.      Objective:    BP 137/74   Pulse 75   Wt 158 lb (71.7 kg)   SpO2 100%   BMI 22.04 kg/m  Nursing note and vital signs reviewed.  Physical Exam Constitutional:      General: He is not in acute distress.    Appearance: He is well-developed.  Eyes:     Conjunctiva/sclera: Conjunctivae normal.  Cardiovascular:     Rate and Rhythm: Normal rate and regular rhythm.     Heart sounds: Normal heart sounds. No murmur heard.  No friction rub. No gallop.   Pulmonary:     Effort: Pulmonary effort is normal. No respiratory distress.     Breath sounds: Normal breath sounds. No wheezing or rales.  Chest:     Chest wall: No tenderness.  Abdominal:     General: Bowel sounds are normal.     Palpations: Abdomen is soft.     Tenderness: There is no abdominal tenderness.  Musculoskeletal:     Cervical back: Neck supple.  Lymphadenopathy:     Cervical:  No cervical adenopathy.  Skin:    General: Skin is warm and dry.     Findings: No rash.  Neurological:     Mental Status: He is alert and oriented to person, place, and time.  Psychiatric:        Behavior: Behavior normal.        Thought Content: Thought content normal.        Judgment: Judgment normal.      Depression screen [facility] 2/9 10/16/2019 08/29/2019  Decreased Interest 0 0  Down, Depressed, Hopeless 0 0  PHQ - 2 Score 0 0       Assessment & Plan:    Patient Active Problem List     Diagnosis Date Noted  . Healthcare maintenance 10/16/2019  . Human immunodeficiency virus (HIV) disease ([facility]) 07/31/2019     Problem List Items Addressed This Visit      Other   Human immunodeficiency virus (HIV) disease ([facility]) - Primary    Logan Jacobs continues to have well-controlled HIV disease with good adherence and tolerance to his ART regimen of Biktarvy.  No signs/symptoms of opportunistic infection or progressive HIV disease.  Reviewed previous lab work and discussed the plan of care.  Continue current dose of Biktarvy.  Check blood work today.  Plan for follow-up in 2 months or sooner if needed with lab work on the same day.      Relevant Medications   bictegravir-emtricitabine-tenofovir AF (BIKTARVY) 50-200-25 MG TABS tablet   Other Relevant Orders   COMPLETE METABOLIC PANEL WITH GFR   HIV-1 RNA quant-no reflex-bld   T-helper cell (CD4)- ([facility] clinic only)   Healthcare maintenance     Declines vaccinations today.  Discussed/encouraged Covid vaccine.  Discussed importance of safe sexual practice to reduce risk of STI.  Condoms declined.  Recommended routine dental care with primary dental insurance.       Other Visit Diagnoses    Screening for STDs (sexually transmitted diseases)       Relevant Orders   RPR   Cytology (oral, anal, urethral) ancillary only   Cytology (oral, anal, urethral) ancillary only   Urine cytology ancillary only([place])       I am having Logan Jacobs maintain his albuterol and bictegravir-emtricitabine-tenofovir AF.   Meds ordered this encounter  Medications  . bictegravir-emtricitabine-tenofovir AF (BIKTARVY) 50-200-25 MG TABS tablet    Sig: Take 1 tablet by mouth daily.    Dispense:  30 tablet    Refill:  2    Order Specific Question:   Supervising Provider    Answer:   Judyann Munson [[id number]]     Follow-up: Return in about 2 months (around 12/16/2019), or if symptoms worsen or fail to improve.   Marcos Eke, MSN,  FNP-C Nurse Practitioner [facility] Disease [facility] Main number: [phone]

## 2019-10-17 LAB — T-HELPER CELL (CD4) - (RCID CLINIC ONLY)
CD4 % Helper T Cell: 35 % (ref 33–65)
CD4 T Cell Abs: 847 /uL (ref 400–1790)

## 2019-10-18 LAB — CYTOLOGY, (ORAL, ANAL, URETHRAL) ANCILLARY ONLY
Chlamydia: NEGATIVE
Chlamydia: NEGATIVE
Comment: NEGATIVE
Comment: NEGATIVE
Comment: NORMAL
Comment: NORMAL
Neisseria Gonorrhea: NEGATIVE
Neisseria Gonorrhea: NEGATIVE

## 2019-10-18 LAB — URINE CYTOLOGY ANCILLARY ONLY
Chlamydia: NEGATIVE
Comment: NEGATIVE
Comment: NORMAL
Neisseria Gonorrhea: NEGATIVE

## 2019-10-19 LAB — COMPLETE METABOLIC PANEL WITH GFR
AG Ratio: 2 (calc) (ref 1.0–2.5)
ALT: 17 U/L (ref 9–46)
AST: 24 U/L (ref 10–40)
Albumin: 4.7 g/dL (ref 3.6–5.1)
Alkaline phosphatase (APISO): 75 U/L (ref 36–130)
BUN: 10 mg/dL (ref 7–25)
CO2: 27 mmol/L (ref 20–32)
Calcium: 9.9 mg/dL (ref 8.6–10.3)
Chloride: 106 mmol/L (ref 98–110)
Creat: 1 mg/dL (ref 0.60–1.35)
GFR, Est African American: 124 mL/min/{1.73_m2} (ref >=60)
GFR, Est Non African American: 107 mL/min/{1.73_m2} (ref >=60)
Globulin: 2.3 g/dL (calc) (ref 1.9–3.7)
Glucose, Bld: 76 mg/dL (ref 65–99)
Potassium: 3.9 mmol/L (ref 3.5–5.3)
Sodium: 141 mmol/L (ref 135–146)
Total Bilirubin: 0.5 mg/dL (ref 0.2–1.2)
Total Protein: 7 g/dL (ref 6.1–8.1)

## 2019-10-19 LAB — HIV-1 RNA QUANT-NO REFLEX-BLD
HIV 1 RNA Quant: <20 Copies/mL — ABNORMAL HIGH
HIV-1 RNA Quant, Log: <1.3 Log cps/mL — ABNORMAL HIGH

## 2019-10-19 LAB — RPR: RPR Ser Ql: NONREACTIVE

## 2019-11-12 ENCOUNTER — Telehealth: Payer: Self-pay | Admitting: *Deleted

## 2019-11-12 NOTE — Telephone Encounter (Signed)
Received notification from [facility] that they are having difficulty contacting patient. RN notified patient, will send mychart message to patient with phone number. Andree Coss, RN

## 2019-12-17 ENCOUNTER — Encounter: Payer: Self-pay | Admitting: Family

## 2019-12-17 ENCOUNTER — Ambulatory Visit (INDEPENDENT_AMBULATORY_CARE_PROVIDER_SITE_OTHER): Payer: BLUE CROSS/BLUE SHIELD | Admitting: Family

## 2019-12-17 ENCOUNTER — Other Ambulatory Visit: Payer: Self-pay

## 2019-12-17 VITALS — BP 125/84 | HR 83 | Resp 16 | Ht 71.0 in | Wt 160.6 lb

## 2019-12-17 DIAGNOSIS — Z0000 Encounter for general adult medical examination without abnormal findings: Secondary | ICD-10-CM

## 2019-12-17 DIAGNOSIS — Z113 Encounter for screening for infections with a predominantly sexual mode of transmission: Secondary | ICD-10-CM

## 2019-12-17 DIAGNOSIS — B20 Human immunodeficiency virus [HIV] disease: Secondary | ICD-10-CM | POA: Diagnosis not present

## 2019-12-17 MED ORDER — BICTEGRAVIR-EMTRICITAB-TENOFOV 50-200-25 MG PO TABS: 1.0000 | ORAL_TABLET | Freq: Every day | ORAL | 5 refills | Status: DC

## 2019-12-17 NOTE — Assessment & Plan Note (Signed)
Mr. Logan Jacobs continues to have well-controlled HIV disease with good adherence and tolerance to his ART regimen of Biktarvy.  No signs/symptoms of opportunistic infection or progressive HIV disease.  Reviewed previous lab work and discussed plan of care.  Continue current dose of Biktarvy.  Check blood work today.  Plan for follow-up in 3 months or sooner if needed with lab work on the same day.

## 2019-12-17 NOTE — Progress Notes (Signed)
Subjective:    Patient ID: Logan Jacobs, male    DOB: 10/30/2000, 19 y.o.   MRN: [id number]  Chief Complaint  Patient presents with  . Follow-up    b20-condoms given      HPI:  Logan Jacobs is a 19 y.o. male with HIV disease who was last seen in the office on 10/16/2019 with good adherence and tolerance to his ART regimen Biktarvy and well-controlled HIV.  Lab work at the time showed a viral load that was undetectable and CD4 count of 847.  No recent blood work completed.  Here today for routine follow-up.  Logan Jacobs continues to take his Biktarvy daily as prescribed with no adverse side effects and 1-2 missed doses since his last office visit.  Overall feeling well today.  Has some concerns regarding generalized spots. Denies fevers, chills, night sweats, headaches, changes in vision, neck pain/stiffness, nausea, diarrhea, vomiting, lesions or rashes.  Logan Jacobs has no problems obtaining his medication from the pharmacy and remains covered through [facility].  Denies feelings of being down, depressed, or hopeless recently.  No recreational or illicit drug use and remains a light tobacco smoker and occasional alcohol consumption.  Continues to work full-time at [employer].  Declines influenza vaccine.  Declines Covid vaccine.  Due for routine dental care.  Declines STI testing. Due for dental care/  No Known Allergies    Outpatient Medications Prior to Visit  Medication Sig Dispense Refill  . albuterol (PROVENTIL HFA;VENTOLIN HFA) 108 (90 Base) MCG/ACT inhaler Inhale 2 puffs into the lungs every 4 (four) hours as needed for wheezing or shortness of breath. 1 Inhaler 1  . bictegravir-emtricitabine-tenofovir AF (BIKTARVY) 50-200-25 MG TABS tablet Take 1 tablet by mouth daily. 30 tablet 2   No facility-administered medications prior to visit.     Past Medical History:  Diagnosis Date  . Asthma   . HIV (human immunodeficiency virus infection) (HCC)      History  reviewed. No pertinent surgical history.   Review of Systems  Constitutional: Negative for appetite change, chills, fatigue, fever and unexpected weight change.  Eyes: Negative for visual disturbance.  Respiratory: Negative for cough, chest tightness, shortness of breath and wheezing.   Cardiovascular: Negative for chest pain and leg swelling.  Gastrointestinal: Negative for abdominal pain, constipation, diarrhea, nausea and vomiting.  Genitourinary: Negative for dysuria, flank pain, frequency, genital sores, hematuria and urgency.  Skin: Negative for rash.  Allergic/Immunologic: Negative for immunocompromised state.  Neurological: Negative for dizziness and headaches.      Objective:    BP 125/84   Pulse 83   Resp 16   Ht 5\' 11"  (1.803 m)   Wt 160 lb 9.6 oz (72.8 kg)   SpO2 100%   BMI 22.40 kg/m  Nursing note and vital signs reviewed.  Physical Exam Constitutional:      General: He is not in acute distress.    Appearance: He is well-developed.  Eyes:     Conjunctiva/sclera: Conjunctivae normal.  Cardiovascular:     Rate and Rhythm: Normal rate and regular rhythm.     Heart sounds: Normal heart sounds. No murmur heard.  No friction rub. No gallop.   Pulmonary:     Effort: Pulmonary effort is normal. No respiratory distress.     Breath sounds: Normal breath sounds. No wheezing or rales.  Chest:     Chest wall: No tenderness.  Abdominal:     General: Bowel sounds are normal.     Palpations:  Abdomen is soft.     Tenderness: There is no abdominal tenderness.  Musculoskeletal:     Cervical back: Neck supple.  Lymphadenopathy:     Cervical: No cervical adenopathy.  Skin:    General: Skin is warm and dry.     Findings: No rash.  Neurological:     Mental Status: He is alert and oriented to person, place, and time.  Psychiatric:        Behavior: Behavior normal.        Thought Content: Thought content normal.        Judgment: Judgment normal.      Depression  screen [facility] 2/9 12/17/2019 10/16/2019 08/29/2019  Decreased Interest 0 0 0  Down, Depressed, Hopeless 0 0 0  PHQ - 2 Score 0 0 0       Assessment & Plan:    Patient Active Problem List   Diagnosis Date Noted  . Healthcare maintenance 10/16/2019  . Human immunodeficiency virus (HIV) disease (HCC) 07/31/2019     Problem List Items Addressed This Visit      Other   Human immunodeficiency virus (HIV) disease (HCC) - Primary    Logan Jacobs continues to have well-controlled HIV disease with good adherence and tolerance to his ART regimen of Biktarvy.  No signs/symptoms of opportunistic infection or progressive HIV disease.  Reviewed previous lab work and discussed plan of care.  Continue current dose of Biktarvy.  Check blood work today.  Plan for follow-up in 3 months or sooner if needed with lab work on the same day.      Relevant Medications   bictegravir-emtricitabine-tenofovir AF (BIKTARVY) 50-200-25 MG TABS tablet   Other Relevant Orders   COMPLETE METABOLIC PANEL WITH GFR   T-helper cell (CD4)- ([facility] only)   HIV-1 RNA quant-no reflex-bld   Healthcare maintenance     Declines influenza vaccine.  Discussed/recommended Covid vaccine.  Discussed importance of safe sexual practice to reduce risk of STI.  Condoms provided.  Due for routine dental care which he will schedule independently.       Other Visit Diagnoses    Screening for STDs (sexually transmitted diseases)       Relevant Orders   RPR       I am having Donn Zanetti maintain his albuterol and bictegravir-emtricitabine-tenofovir AF.   Meds ordered this encounter  Medications  . bictegravir-emtricitabine-tenofovir AF (BIKTARVY) 50-200-25 MG TABS tablet    Sig: Take 1 tablet by mouth daily.    Dispense:  30 tablet    Refill:  5    Order Specific Question:   Supervising Provider    Answer:   Judyann Munson [[id number]]     Follow-up: Return in about 3 months (around 03/18/2020), or if symptoms worsen or  fail to improve.   Marcos Eke, MSN, FNP-C Nurse Practitioner [facility] for Infectious Disease [facility] Main number: [phone]

## 2019-12-17 NOTE — Patient Instructions (Addendum)
Nice to see you.   We will check your blood work today.  Continue to take your [place] daily as prescribed.   Refills have been sent to the pharmacy.  Plan for follow-up in 3 months or sooner if needed with lab work 1 to 2 weeks prior to appointment.  Have a great day and stay safe!

## 2019-12-17 NOTE — Assessment & Plan Note (Addendum)
   Declines influenza vaccine.  Discussed/recommended Covid vaccine.  Discussed importance of safe sexual practice to reduce risk of STI.  Condoms provided.  Due for routine dental care which he will schedule independently.

## 2019-12-18 LAB — T-HELPER CELL (CD4) - (RCID CLINIC ONLY)
CD4 % Helper T Cell: 36 % (ref 33–65)
CD4 T Cell Abs: 946 /uL (ref 400–1790)

## 2019-12-21 LAB — COMPLETE METABOLIC PANEL WITH GFR
AG Ratio: 1.6 (calc) (ref 1.0–2.5)
ALT: 17 U/L (ref 9–46)
AST: 24 U/L (ref 10–40)
Albumin: 4.6 g/dL (ref 3.6–5.1)
Alkaline phosphatase (APISO): 77 U/L (ref 36–130)
BUN: 10 mg/dL (ref 7–25)
CO2: 27 mmol/L (ref 20–32)
Calcium: 9.9 mg/dL (ref 8.6–10.3)
Chloride: 105 mmol/L (ref 98–110)
Creat: 0.95 mg/dL (ref 0.60–1.35)
GFR, Est African American: 132 mL/min/{1.73_m2} (ref >=60)
GFR, Est Non African American: 114 mL/min/{1.73_m2} (ref >=60)
Globulin: 2.8 g/dL (calc) (ref 1.9–3.7)
Glucose, Bld: 86 mg/dL (ref 65–99)
Potassium: 4.1 mmol/L (ref 3.5–5.3)
Sodium: 141 mmol/L (ref 135–146)
Total Bilirubin: 0.7 mg/dL (ref 0.2–1.2)
Total Protein: 7.4 g/dL (ref 6.1–8.1)

## 2019-12-21 LAB — RPR: RPR Ser Ql: NONREACTIVE

## 2019-12-21 LAB — HIV-1 RNA QUANT-NO REFLEX-BLD
HIV 1 RNA Quant: 35 Copies/mL — ABNORMAL HIGH
HIV-1 RNA Quant, Log: 1.54 Log cps/mL — ABNORMAL HIGH

## 2020-03-07 ENCOUNTER — Other Ambulatory Visit: Payer: Self-pay | Admitting: Family

## 2020-03-11 ENCOUNTER — Ambulatory Visit: Payer: BLUE CROSS/BLUE SHIELD | Admitting: Family

## 2020-03-14 ENCOUNTER — Encounter: Payer: Self-pay | Admitting: Family

## 2020-03-14 ENCOUNTER — Other Ambulatory Visit: Payer: Self-pay

## 2020-03-14 ENCOUNTER — Ambulatory Visit (INDEPENDENT_AMBULATORY_CARE_PROVIDER_SITE_OTHER): Payer: BLUE CROSS/BLUE SHIELD | Admitting: Family

## 2020-03-14 VITALS — BP 122/81 | HR 90 | Temp 97.7°F | Wt 165.0 lb

## 2020-03-14 DIAGNOSIS — B20 Human immunodeficiency virus [HIV] disease: Secondary | ICD-10-CM

## 2020-03-14 DIAGNOSIS — Z0000 Encounter for general adult medical examination without abnormal findings: Secondary | ICD-10-CM

## 2020-03-14 DIAGNOSIS — Z113 Encounter for screening for infections with a predominantly sexual mode of transmission: Secondary | ICD-10-CM | POA: Diagnosis not present

## 2020-03-14 NOTE — Progress Notes (Signed)
Subjective:    Patient ID: Logan Jacobs, male    DOB: 09/17/2000, 20 y.o.   MRN: [id number]  Chief Complaint  Patient presents with  . Follow-up    B20     HPI:  Logan Jacobs is a 20 y.o. male with HIV disease who was last seen on 11/8 with well controlled virus and good adherence and tolerance to his ART regimen of Biktarvy. Viral load at the time was undetectable and CD4 count is 946. Here today for routine follow up.   [name] has been taking his Biktarvy daily as prescribed daily with no adverse side effects or missed doses since his last office visit. Overall feeling well today with no new concerns/complaints. Denies fevers, chills, night sweats, headaches, changes in vision, neck pain/stiffness, nausea, diarrhea, vomiting, lesions or rashes.  [name] has no problems obtaining his medication from the pharmacy. Denies feelings of being down, depressed or hopeless. Due for routine dental care. Declines COVID and influenza vaccinations. Declines condoms.    No Known Allergies    Outpatient Medications Prior to Visit  Medication Sig Dispense Refill  . albuterol (PROVENTIL HFA;VENTOLIN HFA) 108 (90 Base) MCG/ACT inhaler Inhale 2 puffs into the lungs every 4 (four) hours as needed for wheezing or shortness of breath. 1 Inhaler 1  . BIKTARVY 50-200-25 MG TABS tablet TAKE 1 TABLET BY MOUTH DAILY 30 tablet 5   No facility-administered medications prior to visit.     Past Medical History:  Diagnosis Date  . Asthma   . HIV (human immunodeficiency virus infection) (HCC)      History reviewed. No pertinent surgical history.     Review of Systems  Constitutional: Negative for appetite change, chills, fatigue, fever and unexpected weight change.  Eyes: Negative for visual disturbance.  Respiratory: Negative for cough, chest tightness, shortness of breath and wheezing.   Cardiovascular: Negative for chest pain and leg swelling.  Gastrointestinal: Negative for  abdominal pain, constipation, diarrhea, nausea and vomiting.  Genitourinary: Negative for dysuria, flank pain, frequency, genital sores, hematuria and urgency.  Skin: Negative for rash.  Allergic/Immunologic: Negative for immunocompromised state.  Neurological: Negative for dizziness and headaches.      Objective:    BP 122/81   Pulse 90   Temp 97.7 F (36.5 C) (Oral)   Wt 165 lb (74.8 kg)   BMI 23.01 kg/m  Nursing note and vital signs reviewed.  Physical Exam Constitutional:      General: He is not in acute distress.    Appearance: He is well-developed.  HENT:     Mouth/Throat:     Mouth: Oropharynx is clear and moist.  Eyes:     Conjunctiva/sclera: Conjunctivae normal.  Cardiovascular:     Rate and Rhythm: Normal rate and regular rhythm.     Pulses: Intact distal pulses.     Heart sounds: Normal heart sounds. No murmur heard. No friction rub. No gallop.   Pulmonary:     Effort: Pulmonary effort is normal. No respiratory distress.     Breath sounds: Normal breath sounds. No wheezing or rales.  Chest:     Chest wall: No tenderness.  Abdominal:     General: Bowel sounds are normal.     Palpations: Abdomen is soft.     Tenderness: There is no abdominal tenderness.  Musculoskeletal:     Cervical back: Neck supple.  Lymphadenopathy:     Cervical: No cervical adenopathy.  Skin:    General: Skin is warm and dry.  Findings: No rash.  Neurological:     Mental Status: He is alert and oriented to person, place, and time.  Psychiatric:        Mood and Affect: Mood and affect normal.        Behavior: Behavior normal.        Thought Content: Thought content normal.        Judgment: Judgment normal.      Depression screen [facility] 2/9 03/14/2020 12/17/2019 10/16/2019 08/29/2019  Decreased Interest 0 0 0 0  Down, Depressed, Hopeless 0 0 0 0  PHQ - 2 Score 0 0 0 0       Assessment & Plan:    Patient Active Problem List   Diagnosis Date Noted  . Healthcare maintenance  10/16/2019  . Human immunodeficiency virus (HIV) disease (HCC) 07/31/2019     Problem List Items Addressed This Visit      Other   Human immunodeficiency virus (HIV) disease (HCC) - Primary    Mr. Bonn continues to have well controlled HIV disease with good adherence and tolerance to his ART regimen of Biktarvy. No signs/symptoms of opportunistic infection or progressive HIV disease. We reviewed previous lab work and discussed plan of care. Check lab work today. Continue current dose of Biktarvy. Plan for follow up in 3 months or sooner if needed.       Relevant Orders   HIV-1 RNA quant-no reflex-bld   COMPLETE METABOLIC PANEL WITH GFR   T-helper cells (CD4) count   Healthcare maintenance     Discussed importance of safe sexual practice to reduce risk of STI. Condoms declined.   Due for routine dental care and will schedule independently.   Declines vaccines today.        Other Visit Diagnoses    Screening for STDs (sexually transmitted diseases)       Relevant Orders   RPR       I am having Nijee Heatwole maintain his albuterol and Biktarvy.   Follow-up: Return in about 3 months (around 06/11/2020), or if symptoms worsen or fail to improve.   Marcos Eke, MSN, FNP-C Nurse Practitioner [facility] for Infectious Disease [facility] Main number: [phone]

## 2020-03-14 NOTE — Patient Instructions (Addendum)
Nice to see you.   We will check your lab work today.  Continue to take your [place] daily as prescribed.   Refills have been sent to the pharmacy.   Plan for follow up in 3 months or sooner if needed.   Have a great day and stay safe!

## 2020-03-14 NOTE — Assessment & Plan Note (Signed)
Logan Jacobs continues to have well controlled HIV disease with good adherence and tolerance to his ART regimen of Biktarvy. No signs/symptoms of opportunistic infection or progressive HIV disease. We reviewed previous lab work and discussed plan of care. Check lab work today. Continue current dose of Biktarvy. Plan for follow up in 3 months or sooner if needed.

## 2020-03-14 NOTE — Assessment & Plan Note (Signed)
   Discussed importance of safe sexual practice to reduce risk of STI. Condoms declined.   Due for routine dental care and will schedule independently.   Declines vaccines today.

## 2020-03-18 LAB — COMPLETE METABOLIC PANEL WITH GFR
AG Ratio: 1.7 (calc) (ref 1.0–2.5)
ALT: 18 U/L (ref 9–46)
AST: 25 U/L (ref 10–40)
Albumin: 4.7 g/dL (ref 3.6–5.1)
Alkaline phosphatase (APISO): 79 U/L (ref 36–130)
BUN: 13 mg/dL (ref 7–25)
CO2: 28 mmol/L (ref 20–32)
Calcium: 10.2 mg/dL (ref 8.6–10.3)
Chloride: 106 mmol/L (ref 98–110)
Creat: 0.94 mg/dL (ref 0.60–1.35)
GFR, Est African American: 134 mL/min/{1.73_m2} (ref >=60)
GFR, Est Non African American: 115 mL/min/{1.73_m2} (ref >=60)
Globulin: 2.7 g/dL (calc) (ref 1.9–3.7)
Glucose, Bld: 76 mg/dL (ref 65–99)
Potassium: 4.4 mmol/L (ref 3.5–5.3)
Sodium: 141 mmol/L (ref 135–146)
Total Bilirubin: 0.6 mg/dL (ref 0.2–1.2)
Total Protein: 7.4 g/dL (ref 6.1–8.1)

## 2020-03-18 LAB — HIV-1 RNA QUANT-NO REFLEX-BLD
HIV 1 RNA Quant: <20 Copies/mL — ABNORMAL HIGH
HIV-1 RNA Quant, Log: <1.3 Log cps/mL — ABNORMAL HIGH

## 2020-03-18 LAB — T-HELPER CELLS (CD4) COUNT (NOT AT ARMC)
Absolute CD4: 808 cells/uL (ref 490–1740)
CD4 T Helper %: 36 % (ref 30–61)
Total lymphocyte count: 2222 cells/uL (ref 850–3900)

## 2020-03-18 LAB — RPR: RPR Ser Ql: NONREACTIVE

## 2020-03-20 ENCOUNTER — Encounter: Payer: BLUE CROSS/BLUE SHIELD | Admitting: *Deleted

## 2020-06-04 IMAGING — DX DG CHEST 2V
2 series · 2 of 2 positions shown · non-contrast
Comparison: None.

CLINICAL DATA: Cough, asthma and dyspnea today.

EXAM:
CHEST - 2 VIEW

[chest pa]
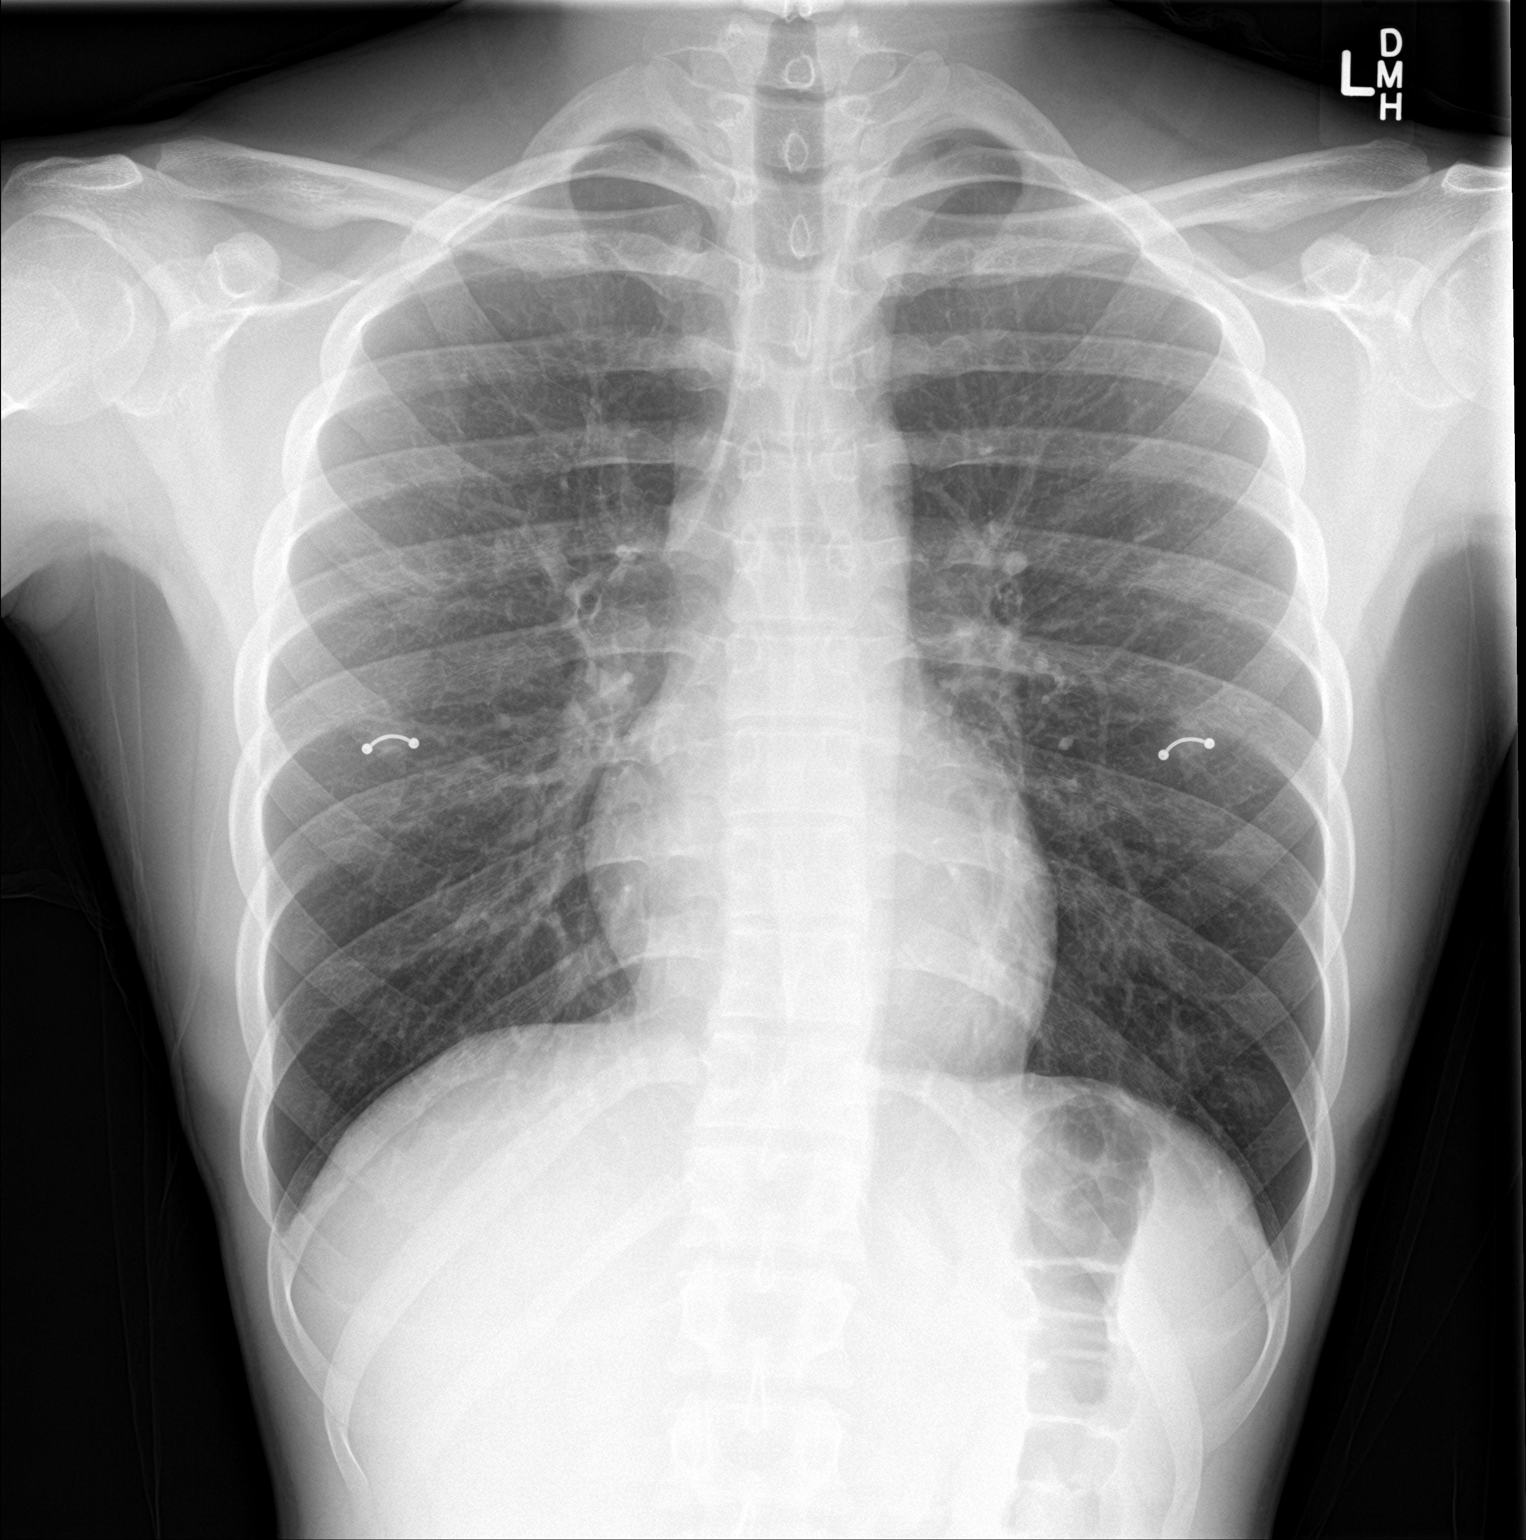

[chest lat]
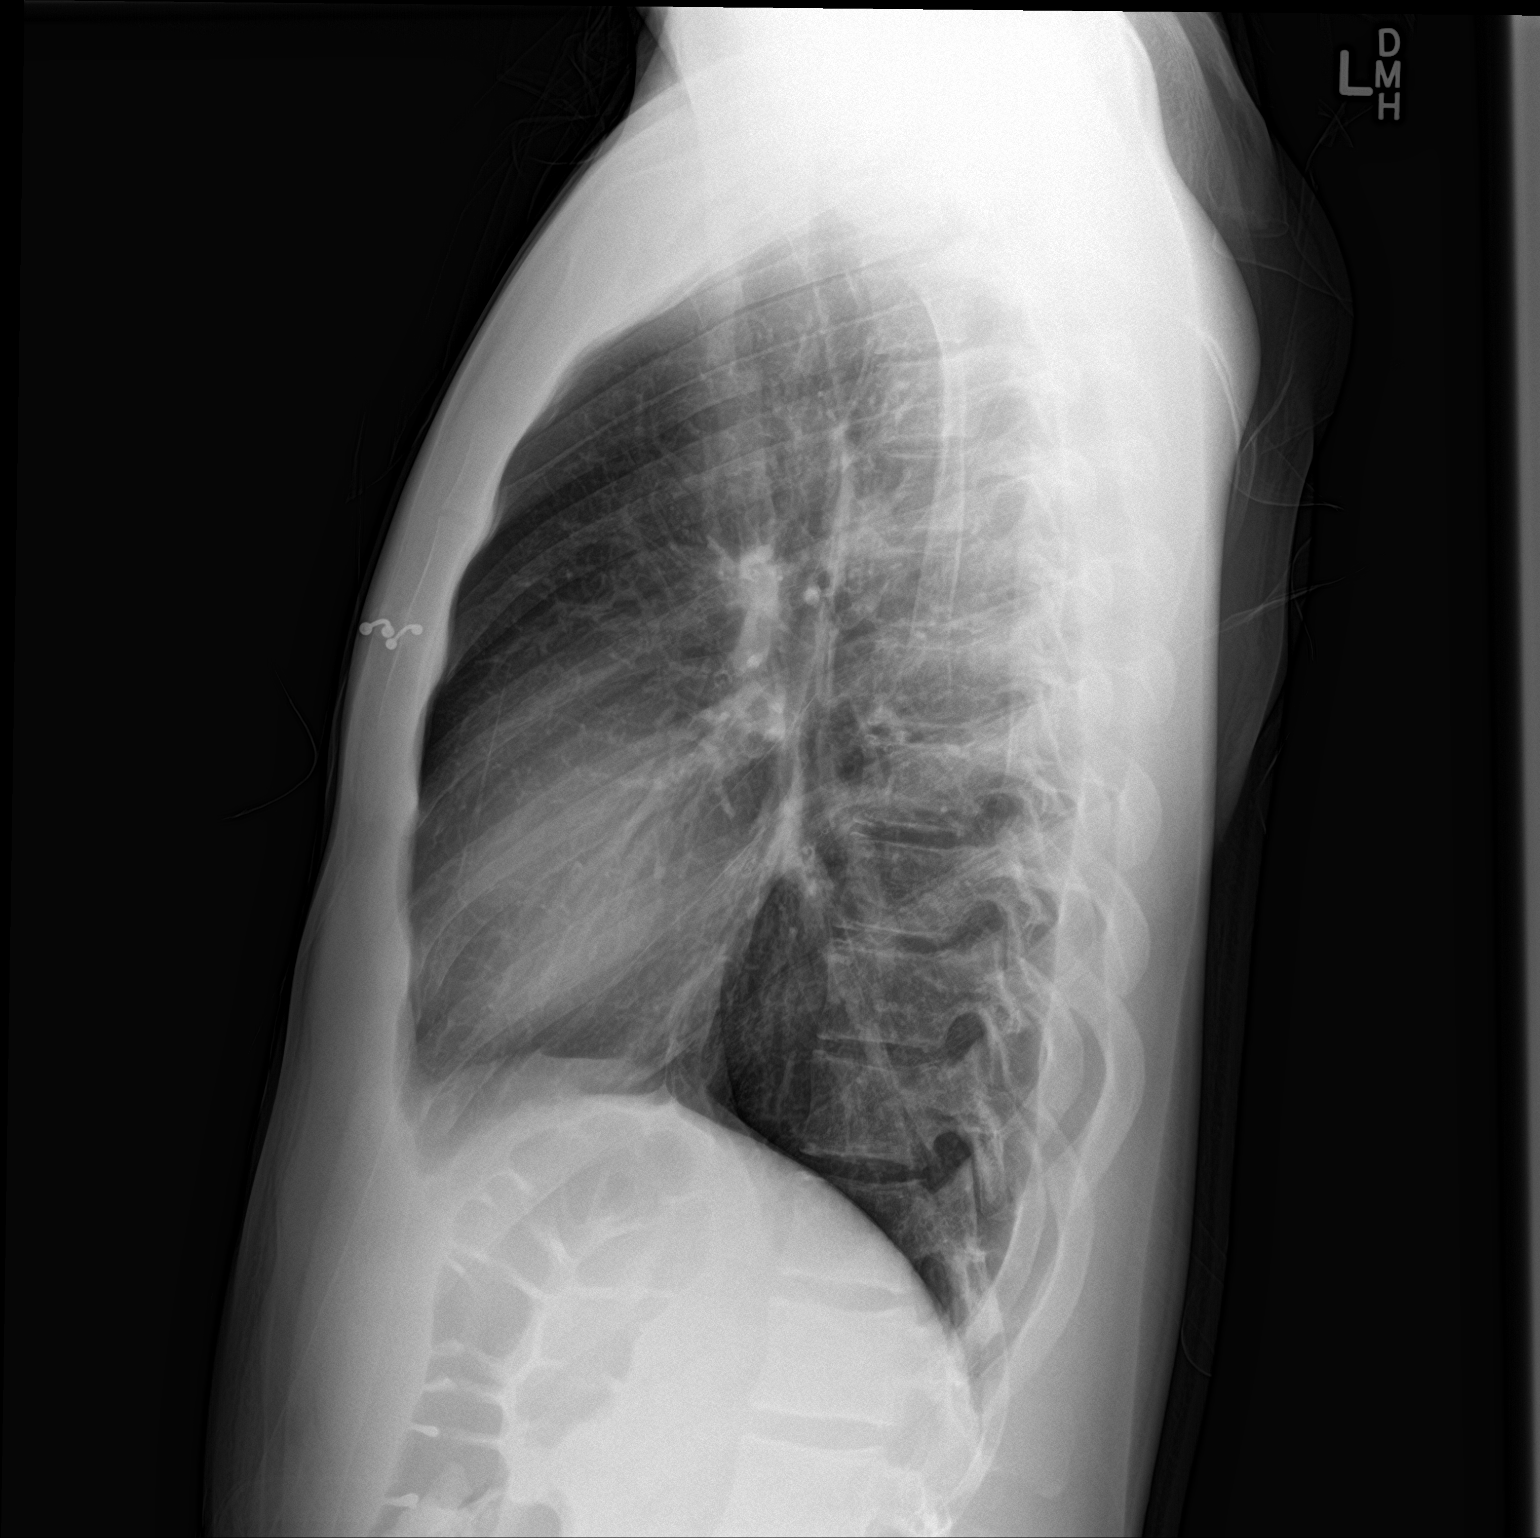

[2 of 2 positions shown; findings below may reference images not displayed]

FINDINGS: The heart size and mediastinal contours are within normal limits.
Both lungs are clear. The visualized skeletal structures are
unremarkable.
IMPRESSION: No active cardiopulmonary disease.

## 2020-06-11 ENCOUNTER — Other Ambulatory Visit (HOSPITAL_COMMUNITY): Payer: Self-pay

## 2020-06-24 ENCOUNTER — Ambulatory Visit: Payer: BLUE CROSS/BLUE SHIELD | Admitting: Family

## 2020-06-30 ENCOUNTER — Encounter: Payer: Self-pay | Admitting: Family

## 2020-06-30 ENCOUNTER — Ambulatory Visit (INDEPENDENT_AMBULATORY_CARE_PROVIDER_SITE_OTHER): Payer: Self-pay | Admitting: Family

## 2020-06-30 ENCOUNTER — Other Ambulatory Visit: Payer: Self-pay | Admitting: Family

## 2020-06-30 ENCOUNTER — Other Ambulatory Visit: Payer: Self-pay

## 2020-06-30 VITALS — BP 125/80 | HR 80 | Temp 98.2°F | Wt 167.0 lb

## 2020-06-30 DIAGNOSIS — B20 Human immunodeficiency virus [HIV] disease: Secondary | ICD-10-CM

## 2020-06-30 DIAGNOSIS — Z23 Encounter for immunization: Secondary | ICD-10-CM

## 2020-06-30 DIAGNOSIS — Z0000 Encounter for general adult medical examination without abnormal findings: Secondary | ICD-10-CM

## 2020-06-30 MED ORDER — BIKTARVY 50-200-25 MG PO TABS: 1.0000 | ORAL_TABLET | Freq: Every day | ORAL | 5 refills | Status: DC

## 2020-06-30 NOTE — Progress Notes (Signed)
Brief Narrative   Patient ID: Logan Jacobs, male    DOB: March 08, 2000, 20 y.o.   MRN: [id number]  Logan Jacobs is a 20 y/o AA male diagnosed with HIV in May of 2021 with risk factor of MSM. Initial CD4 nadir of 626 and viral load of 642,000. Genotype with Subtype B and no significant resistance patterns. No history of opportunistic infection. [id number] negative. Sole ART regimen of Biktarvy.    Subjective:    Chief Complaint  Patient presents with  . Follow-up    B20     HPI:  Logan Jacobs is a 20 y.o. male with HIV disease last seen on 03/14/2020 with well-controlled virus and good adherence and tolerance to his ART regimen of Biktarvy.  Viral load was undetectable with CD4 count of 808.  Here today for routine follow-up.  Logan Jacobs continues to take his Susanne Jacobs daily as prescribed with no adverse side effects or missed doses since his last office visit.  Overall feeling well today with no new concerns/complaints. Denies fevers, chills, night sweats, headaches, changes in vision, neck pain/stiffness, nausea, diarrhea, vomiting, lesions or rashes.  Logan Jacobs has no problems obtaining medication from the pharmacy.  Denies feelings of being down, depressed, or hopeless.  Drinks alcohol on occasion with no recreational or illicit drug use or tobacco use.  Due for routine dental care and will schedule.  Has not had COVID vaccines.  Healthcare maintenance due includes Prevnar. Condoms offered and declined.    No Known Allergies    Outpatient Medications Prior to Visit  Medication Sig Dispense Refill  . albuterol (PROVENTIL HFA;VENTOLIN HFA) 108 (90 Base) MCG/ACT inhaler Inhale 2 puffs into the lungs every 4 (four) hours as needed for wheezing or shortness of breath. 1 Inhaler 1  . BIKTARVY 50-200-25 MG TABS tablet TAKE 1 TABLET BY MOUTH DAILY 30 tablet 5   No facility-administered medications prior to visit.     Past Medical History:  Diagnosis Date  . Asthma   . HIV (human  immunodeficiency virus infection) (HCC)     No past surgical history on file.   Review of Systems  Constitutional: Negative for appetite change, chills, fatigue, fever and unexpected weight change.  Eyes: Negative for visual disturbance.  Respiratory: Negative for cough, chest tightness, shortness of breath and wheezing.   Cardiovascular: Negative for chest pain and leg swelling.  Gastrointestinal: Negative for abdominal pain, constipation, diarrhea, nausea and vomiting.  Genitourinary: Negative for dysuria, flank pain, frequency, genital sores, hematuria and urgency.  Skin: Negative for rash.  Allergic/Immunologic: Negative for immunocompromised state.  Neurological: Negative for dizziness and headaches.      Objective:    BP 125/80   Pulse 80   Temp 98.2 F (36.8 C) (Oral)   Wt 167 lb (75.8 kg)   BMI 23.29 kg/m  Nursing note and vital signs reviewed.  Physical Exam Constitutional:      General: He is not in acute distress.    Appearance: He is well-developed.  Eyes:     Conjunctiva/sclera: Conjunctivae normal.  Cardiovascular:     Rate and Rhythm: Normal rate and regular rhythm.     Heart sounds: Normal heart sounds. No murmur heard. No friction rub. No gallop.   Pulmonary:     Effort: Pulmonary effort is normal. No respiratory distress.     Breath sounds: Normal breath sounds. No wheezing or rales.  Chest:     Chest wall: No tenderness.  Abdominal:     General:  Bowel sounds are normal.     Palpations: Abdomen is soft.     Tenderness: There is no abdominal tenderness.  Musculoskeletal:     Cervical back: Neck supple.  Lymphadenopathy:     Cervical: No cervical adenopathy.  Skin:    General: Skin is warm and dry.     Findings: No rash.  Neurological:     Mental Status: He is alert and oriented to person, place, and time.  Psychiatric:        Behavior: Behavior normal.        Thought Content: Thought content normal.        Judgment: Judgment normal.       Depression screen [facility] 2/9 06/30/2020 03/14/2020 12/17/2019 10/16/2019 08/29/2019  Decreased Interest 0 0 0 0 0  Down, Depressed, Hopeless 0 0 0 0 0  PHQ - 2 Score 0 0 0 0 0       Assessment & Plan:    Patient Active Problem List   Diagnosis Date Noted  . Healthcare maintenance 10/16/2019  . Human immunodeficiency virus (HIV) disease (HCC) 07/31/2019     Problem List Items Addressed This Visit      Other   Human immunodeficiency virus (HIV) disease (HCC) - Primary    Logan Jacobs continues to have well-controlled HIV disease with good adherence and tolerance to his ART regimen of Biktarvy.  No signs/symptoms of opportunistic infection.  We reviewed previous lab work and discussed plan of care.  We will need to renew financial assistance after July 1.  Check lab work today.  Continue current dose of Biktarvy.  Plan for follow-up in 3 months or sooner if needed with lab work on the same day.      Relevant Medications   bictegravir-emtricitabine-tenofovir AF (BIKTARVY) 50-200-25 MG TABS tablet   Other Relevant Orders   HIV-1 RNA quant-no reflex-bld   T-helper cell (CD4)- ([facility] only)   Healthcare maintenance     Discussed importance of safe sexual practice to reduce risk of STI.  Condoms declined.    Prevnar updated today.  Due for routine dental care which he will schedule independently.       Other Visit Diagnoses    Need for vaccination with 13-polyvalent pneumococcal conjugate vaccine       Relevant Orders   Pneumococcal conjugate vaccine 13-valent IM (Completed)       I have changed [employer]. I am also having him maintain his albuterol.   Meds ordered this encounter  Medications  . bictegravir-emtricitabine-tenofovir AF (BIKTARVY) 50-200-25 MG TABS tablet    Sig: Take 1 tablet by mouth daily.    Dispense:  30 tablet    Refill:  5    Order Specific Question:   Supervising Provider    Answer:   Judyann Munson [[id number]]     Follow-up:  Return in about 3 months (around 09/30/2020).   Marcos Eke, MSN, FNP-C Nurse Practitioner [facility] for Infectious Disease [facility] Main number: [phone]

## 2020-06-30 NOTE — Assessment & Plan Note (Addendum)
   Discussed importance of safe sexual practice to reduce risk of STI.  Condoms declined.    Prevnar updated today.  Due for routine dental care which he will schedule independently.

## 2020-06-30 NOTE — Patient Instructions (Signed)
Nice to see you.  Continue take your [place] daily as prescribed.  We will check your blood work today.  Refills of been sent to the pharmacy.  Plan for follow-up in 3 months or sooner if needed with lab work on the same day.  Her great day and stay safe!

## 2020-06-30 NOTE — Assessment & Plan Note (Signed)
Logan Jacobs continues to have well-controlled HIV disease with good adherence and tolerance to his ART regimen of Biktarvy.  No signs/symptoms of opportunistic infection.  We reviewed previous lab work and discussed plan of care.  We will need to renew financial assistance after July 1.  Check lab work today.  Continue current dose of Biktarvy.  Plan for follow-up in 3 months or sooner if needed with lab work on the same day.

## 2020-07-01 LAB — T-HELPER CELL (CD4) - (RCID CLINIC ONLY)
CD4 % Helper T Cell: 38 % (ref 33–65)
CD4 T Cell Abs: 831 /uL (ref 400–1790)

## 2020-07-02 LAB — HIV-1 RNA QUANT-NO REFLEX-BLD
HIV 1 RNA Quant: <20 Copies/mL — ABNORMAL HIGH
HIV-1 RNA Quant, Log: <1.3 Log cps/mL — ABNORMAL HIGH

## 2020-09-22 ENCOUNTER — Ambulatory Visit: Payer: BLUE CROSS/BLUE SHIELD | Admitting: Family

## 2020-09-27 ENCOUNTER — Encounter (HOSPITAL_COMMUNITY): Payer: Self-pay | Admitting: Emergency Medicine

## 2020-09-27 ENCOUNTER — Other Ambulatory Visit: Payer: Self-pay

## 2020-09-27 ENCOUNTER — Ambulatory Visit (HOSPITAL_COMMUNITY)
Admission: EM | Admit: 2020-09-27 | Discharge: 2020-09-27 | Disposition: A | Discharge status: Home / Self Care | Payer: BLUE CROSS/BLUE SHIELD | Attending: Medical Oncology | Admitting: Medical Oncology

## 2020-09-27 ENCOUNTER — Ambulatory Visit (INDEPENDENT_AMBULATORY_CARE_PROVIDER_SITE_OTHER): Payer: BLUE CROSS/BLUE SHIELD

## 2020-09-27 DIAGNOSIS — M79672 Pain in left foot: Secondary | ICD-10-CM

## 2020-09-27 MED ORDER — PREDNISONE 10 MG (21) PO TBPK: ORAL_TABLET | Freq: Every day | ORAL | 0 refills | Status: DC

## 2020-09-27 NOTE — ED Provider Notes (Signed)
[facility]    CSN: [id number] Arrival date & time: 09/27/20  1048      History   Chief Complaint Chief Complaint  Patient presents with   Foot Pain    left    HPI Tyquavious Gamel is a 20 y.o. male.   HPI  Foot Pain: Patient reports that for the past 2 days he has had fairly significant left foot pain.  He denies any known injury but states that recently at work he was having to do a lot of running back and forth and questions if he hurt something.  He states that he is now not able to walk on the foot.  He reports that he is having some little bit of numbness and tingling in the foot in the area of pain starting today but previously did not have this.  No skin color changes, swelling or increased heat.  No previous injuries.  No fevers.   Past Medical History:  Diagnosis Date   Asthma    HIV (human immunodeficiency virus infection) (HCC)     Patient Active Problem List   Diagnosis Date Noted   Healthcare maintenance 10/16/2019   Human immunodeficiency virus (HIV) disease (HCC) 07/31/2019    History reviewed. No pertinent surgical history.   Home Medications    Prior to Admission medications   Medication Sig Start Date End Date Taking? Authorizing Provider  albuterol (PROVENTIL HFA;VENTOLIN HFA) 108 (90 Base) MCG/ACT inhaler Inhale 2 puffs into the lungs every 4 (four) hours as needed for wheezing or shortness of breath. 03/10/18   Cristina Gong, PA-C  bictegravir-emtricitabine-tenofovir AF (BIKTARVY) 50-200-25 MG TABS tablet Take 1 tablet by mouth daily. 06/30/20   Veryl Speak, FNP    Family History Family History  Problem Relation Age of Onset   Hypertension Mother    Healthy Father     Social History Social History   Tobacco Use   Smoking status: Former    Types: Cigarettes   Smokeless tobacco: Never  Vaping Use   Vaping Use: Never used  Substance Use Topics   Alcohol use: Yes    Comment: occ   Drug use: Never      Allergies   Patient has no known allergies.   Review of Systems Review of Systems  As stated above in HPI Physical Exam Triage Vital Signs ED Triage Vitals  Enc Vitals Group     BP 09/27/20 1156 (!) 157/84     Pulse Rate 09/27/20 1156 67     Resp 09/27/20 1156 16     Temp 09/27/20 1156 98.3 F (36.8 C)     Temp Source 09/27/20 1156 Oral     SpO2 09/27/20 1156 98 %     Weight --      Height --      Head Circumference --      Peak Flow --      Pain Score 09/27/20 1155 10     Pain Loc --      Pain Edu? --      Excl. in GC? --    No data found.  Updated Vital Signs BP (!) 157/84 (BP Location: Left Arm)   Pulse 67   Temp 98.3 F (36.8 C) (Oral)   Resp 16   SpO2 98%   Physical Exam Vitals and nursing note reviewed.  Constitutional:      General: He is not in acute distress.    Appearance: Normal appearance. He is not ill-appearing,  toxic-appearing or diaphoretic.  Cardiovascular:     Pulses: Normal pulses.  Musculoskeletal:     Right foot: No swelling, Charcot foot or foot drop.     Left foot: Tenderness and bony tenderness present. No swelling, Charcot foot or foot drop.       Legs:     Comments: Area pain in red.   Skin:    General: Skin is warm.     Capillary Refill: Capillary refill takes less than 2 seconds.     Coloration: Skin is not jaundiced or pale.     Findings: No bruising, erythema, lesion or rash.  Neurological:     Mental Status: He is alert.     UC Treatments / Results  Labs (all labs ordered are listed, but only abnormal results are displayed) Labs Reviewed - No data to display  EKG   Radiology No results found.  Procedures Procedures (including critical care time)  Medications Ordered in UC Medications - No data to display  Initial Impression / Assessment and Plan / UC Course  I have reviewed the triage vital signs and the nursing notes.  Pertinent labs & imaging results that were available during my care of the  patient were reviewed by me and considered in my medical decision making (see chart for details).     New. X ray pending.   UPDATE:  X ray does not show fracture. Will treat with RICE and prednisone given medications which interact with NSAIDs. Podiatry follow up if unimproved.    Final Clinical Impressions(s) / UC Diagnoses   Final diagnoses:  None   Discharge Instructions   None    ED Prescriptions   None    PDMP not reviewed this encounter.   Rushie Chestnut, [place] 09/27/20 1251

## 2020-09-27 NOTE — ED Triage Notes (Addendum)
Pt presents with left foot pain xs 3 days. Ibuprofen has not even any relief.

## 2020-10-14 ENCOUNTER — Other Ambulatory Visit: Payer: Self-pay

## 2020-10-14 ENCOUNTER — Ambulatory Visit (INDEPENDENT_AMBULATORY_CARE_PROVIDER_SITE_OTHER): Payer: BLUE CROSS/BLUE SHIELD | Admitting: Family

## 2020-10-14 ENCOUNTER — Encounter: Payer: Self-pay | Admitting: Family

## 2020-10-14 VITALS — BP 126/78 | HR 62 | Temp 97.9°F | Wt 167.0 lb

## 2020-10-14 DIAGNOSIS — Z113 Encounter for screening for infections with a predominantly sexual mode of transmission: Secondary | ICD-10-CM

## 2020-10-14 DIAGNOSIS — Z23 Encounter for immunization: Secondary | ICD-10-CM | POA: Diagnosis not present

## 2020-10-14 DIAGNOSIS — B20 Human immunodeficiency virus [HIV] disease: Secondary | ICD-10-CM

## 2020-10-14 DIAGNOSIS — Z0000 Encounter for general adult medical examination without abnormal findings: Secondary | ICD-10-CM | POA: Diagnosis not present

## 2020-10-14 MED ORDER — BIKTARVY 50-200-25 MG PO TABS: 1.0000 | ORAL_TABLET | Freq: Every day | ORAL | 5 refills | Status: DC

## 2020-10-14 NOTE — Patient Instructions (Signed)
Nice to see you.   We will check your lab work today.  Refills of medication have been sent to the pharmacy.  Plan for follow up in 4 months or sooner if needed with lab work on the same day.   Have a great day and stay safe!

## 2020-10-14 NOTE — Progress Notes (Signed)
Brief Narrative   Patient ID: Logan Jacobs, male    DOB: 12-17-2000, 20 y.o.   MRN: [id number]  Logan Jacobs is a 20 y/o AA male diagnosed with HIV in May of 2021 with risk factor of MSM. Initial CD4 nadir of 626 and viral load of 642,000. Genotype with Subtype B and no significant resistance patterns. No history of opportunistic infection. [id number] negative. Sole ART regimen of Biktarvy.   Subjective:    Chief Complaint  Patient presents with   HIV Positive/AIDS    HPI:  Logan Jacobs is a 20 y.o. male with HIV disease last seen on 06/30/2020 with well-controlled virus and good adherence and tolerance to his ART regimen of Biktarvy.  Viral load was undetectable with CD4 count of 831.  Here today for routine follow-up.  Logan Jacobs continues to take his Biktarvy daily as prescribed with no adverse side effects or missed doses.  Overall feeling well today with no new concerns/complaints. Denies fevers, chills, night sweats, headaches, changes in vision, neck pain/stiffness, nausea, diarrhea, vomiting, lesions or rashes.  Logan Jacobs has no problems obtaining medication from the pharmacy and remains covered through [facility].  Denies feelings of being down, depressed, or hopeless recently.  Drinks alcohol on occasion with no current recreational illicit drug use or tobacco use.  Condoms offered and declined.  Healthcare maintenance due includes Pneumovax.   No Known Allergies    Outpatient Medications Prior to Visit  Medication Sig Dispense Refill   albuterol (PROVENTIL HFA;VENTOLIN HFA) 108 (90 Base) MCG/ACT inhaler Inhale 2 puffs into the lungs every 4 (four) hours as needed for wheezing or shortness of breath. 1 Inhaler 1   bictegravir-emtricitabine-tenofovir AF (BIKTARVY) 50-200-25 MG TABS tablet Take 1 tablet by mouth daily. 30 tablet 5   predniSONE (STERAPRED UNI-PAK 21 TAB) 10 MG (21) TBPK tablet Take by mouth daily. Take 6 tabs by mouth daily  for 2 days, then 5 tabs  for 2 days, then 4 tabs for 2 days, then 3 tabs for 2 days, 2 tabs for 2 days, then 1 tab by mouth daily for 2 days (Patient not taking: Reported on 10/14/2020) 42 tablet 0   No facility-administered medications prior to visit.     Past Medical History:  Diagnosis Date   Asthma    HIV (human immunodeficiency virus infection) ([place])      History reviewed. No pertinent surgical history.    Review of Systems  Constitutional:  Negative for appetite change, chills, fatigue, fever and unexpected weight change.  Eyes:  Negative for visual disturbance.  Respiratory:  Negative for cough, chest tightness, shortness of breath and wheezing.   Cardiovascular:  Negative for chest pain and leg swelling.  Gastrointestinal:  Negative for abdominal pain, constipation, diarrhea, nausea and vomiting.  Genitourinary:  Negative for dysuria, flank pain, frequency, genital sores, hematuria and urgency.  Skin:  Negative for rash.  Allergic/Immunologic: Negative for immunocompromised state.  Neurological:  Negative for dizziness and headaches.     Objective:    BP 126/78   Pulse 62   Temp 97.9 F (36.6 C) (Oral)   Wt 167 lb (75.8 kg)   BMI 23.29 kg/m  Nursing note and vital signs reviewed.  Physical Exam Constitutional:      General: He is not in acute distress.    Appearance: He is well-developed.  Eyes:     Conjunctiva/sclera: Conjunctivae normal.  Cardiovascular:     Rate and Rhythm: Normal rate and regular rhythm.  Heart sounds: Normal heart sounds. No murmur heard.   No friction rub. No gallop.  Pulmonary:     Effort: Pulmonary effort is normal. No respiratory distress.     Breath sounds: Normal breath sounds. No wheezing or rales.  Chest:     Chest wall: No tenderness.  Abdominal:     General: Bowel sounds are normal.     Palpations: Abdomen is soft.     Tenderness: There is no abdominal tenderness.  Musculoskeletal:     Cervical back: Neck supple.  Lymphadenopathy:      Cervical: No cervical adenopathy.  Skin:    General: Skin is warm and dry.     Findings: No rash.  Neurological:     Mental Status: He is alert and oriented to person, place, and time.  Psychiatric:        Behavior: Behavior normal.        Thought Content: Thought content normal.        Judgment: Judgment normal.     Depression screen [facility] 2/9 10/14/2020 06/30/2020 03/14/2020 12/17/2019 10/16/2019  Decreased Interest 0 0 0 0 0  Down, Depressed, Hopeless 0 0 0 0 0  PHQ - 2 Score 0 0 0 0 0       Assessment & Plan:    Patient Active Problem List   Diagnosis Date Noted   Healthcare maintenance 10/16/2019   Human immunodeficiency virus (HIV) disease ([place]) 07/31/2019     Problem List Items Addressed This Visit       Other   Human immunodeficiency virus (HIV) disease ([place]) - Primary    Mr. Girtha Rm continues to have well-controlled virus with good adherence and tolerance to his ART regimen of Biktarvy.  No signs/symptoms of opportunistic infection or progressive HIV.  We reviewed lab work and discussed plan of care.  Continue current dose of Biktarvy.  Check blood work today.  Plan for follow-up in 4 months or sooner if needed with lab work on the same day.      Relevant Medications   bictegravir-emtricitabine-tenofovir AF (BIKTARVY) 50-200-25 MG TABS tablet   Other Relevant Orders   Comp Met (CMET) (Completed)   HIV-1 RNA quant-no reflex-bld   T-helper cells (CD4) count   Healthcare maintenance    Discussed importance of safe sexual practice to reduce risk of STI.  Condoms offered and declined. Routine dental care is up-to-date per recommendations. Pneumovax updated.      Other Visit Diagnoses     Screening for STDs (sexually transmitted diseases)       Relevant Orders   RPR   Need for pneumococcal vaccination       Relevant Orders   Pneumococcal polysaccharide vaccine 23-valent greater than or equal to 2yo subcutaneous/IM (Completed)        I have discontinued Logan  Jacobs's predniSONE. I am also having him maintain his albuterol and Biktarvy.   Meds ordered this encounter  Medications   bictegravir-emtricitabine-tenofovir AF (BIKTARVY) 50-200-25 MG TABS tablet    Sig: Take 1 tablet by mouth daily.    Dispense:  30 tablet    Refill:  5    Order Specific Question:   Supervising Provider    Answer:   Carlyle Basques [[id number]]     Follow-up: Return in about 4 months (around 02/13/2021), or if symptoms worsen or fail to improve.   Terri Piedra, MSN, FNP-C Nurse Practitioner [facility] [place] number: [phone]

## 2020-10-15 LAB — T-HELPER CELLS (CD4) COUNT (NOT AT ARMC)
CD4 % Helper T Cell: 37 % (ref 33–65)
CD4 T Cell Abs: 810 /uL (ref 400–1790)

## 2020-10-15 NOTE — Assessment & Plan Note (Signed)
Mr. Logan Jacobs continues to have well-controlled virus with good adherence and tolerance to his ART regimen of Biktarvy.  No signs/symptoms of opportunistic infection or progressive HIV.  We reviewed lab work and discussed plan of care.  Continue current dose of Biktarvy.  Check blood work today.  Plan for follow-up in 4 months or sooner if needed with lab work on the same day.

## 2020-10-15 NOTE — Assessment & Plan Note (Signed)
   Discussed importance of safe sexual practice to reduce risk of STI.  Condoms offered and declined.  Routine dental care is up-to-date per recommendations.  Pneumovax updated.

## 2020-10-16 ENCOUNTER — Ambulatory Visit (INDEPENDENT_AMBULATORY_CARE_PROVIDER_SITE_OTHER): Payer: BLUE CROSS/BLUE SHIELD | Admitting: Physician Assistant

## 2020-10-16 ENCOUNTER — Encounter: Payer: Self-pay | Admitting: Physician Assistant

## 2020-10-16 ENCOUNTER — Other Ambulatory Visit: Payer: Self-pay

## 2020-10-16 VITALS — BP 130/82 | HR 82 | Temp 97.6°F | Wt 168.0 lb

## 2020-10-16 DIAGNOSIS — L309 Dermatitis, unspecified: Secondary | ICD-10-CM | POA: Diagnosis not present

## 2020-10-16 LAB — RPR: RPR Ser Ql: NONREACTIVE

## 2020-10-16 LAB — COMPREHENSIVE METABOLIC PANEL
AG Ratio: 1.9 (calc) (ref 1.0–2.5)
ALT: 11 U/L (ref 9–46)
AST: 19 U/L (ref 10–40)
Albumin: 4.9 g/dL (ref 3.6–5.1)
Alkaline phosphatase (APISO): 67 U/L (ref 36–130)
BUN: 11 mg/dL (ref 7–25)
CO2: 28 mmol/L (ref 20–32)
Calcium: 10.3 mg/dL (ref 8.6–10.3)
Chloride: 103 mmol/L (ref 98–110)
Creat: 1.02 mg/dL (ref 0.60–1.24)
Globulin: 2.6 g/dL (calc) (ref 1.9–3.7)
Glucose, Bld: 87 mg/dL (ref 65–99)
Potassium: 4.2 mmol/L (ref 3.5–5.3)
Sodium: 139 mmol/L (ref 135–146)
Total Bilirubin: 0.7 mg/dL (ref 0.2–1.2)
Total Protein: 7.5 g/dL (ref 6.1–8.1)

## 2020-10-16 LAB — HIV-1 RNA QUANT-NO REFLEX-BLD
HIV 1 RNA Quant: <20 Copies/mL — ABNORMAL HIGH
HIV-1 RNA Quant, Log: <1.3 Log cps/mL — ABNORMAL HIGH

## 2020-10-16 MED ORDER — CLOTRIMAZOLE-BETAMETHASONE 1-0.05 % EX CREA: 1.0000 "application " | TOPICAL_CREAM | Freq: Two times a day (BID) | CUTANEOUS | 0 refills | Status: DC

## 2020-10-16 NOTE — Progress Notes (Signed)
Subjective:    Patient ID: Logan Jacobs, male    DOB: 04-19-2000, 20 y.o.   MRN: [id number]  Chief Complaint  Patient presents with   Rash    Right neck x 2 days     HPI:  Logan Jacobs is a 20 y.o. male HIV positive well controlled complains of new onset rash x 2 days.  Rash is located along right neck-shoulder.  Described as red, raised rough dry patches and mildly pruritic.  Denies pain, discharge, or bleeding. He works in a very [employer], [employer]. No new detergents, soaps, food, deodorants, medications, environmental exposures. He has taken no medications OTC to alleviate symptoms. He has no history of allergies, does have history of asthma and cystic acne.  He denies constitutional symptoms, sore throat, HA, SOB, tongue swelling, dizziness, CP, abd pain,n/v/d/c.  In clinic 10/14/20 VL undetectable and CD4 885, taking and tolerating Biktarvy well.   No Known Allergies    Outpatient Medications Prior to Visit  Medication Sig Dispense Refill   albuterol (PROVENTIL HFA;VENTOLIN HFA) 108 (90 Base) MCG/ACT inhaler Inhale 2 puffs into the lungs every 4 (four) hours as needed for wheezing or shortness of breath. 1 Inhaler 1   bictegravir-emtricitabine-tenofovir AF (BIKTARVY) 50-200-25 MG TABS tablet Take 1 tablet by mouth daily. 30 tablet 5   No facility-administered medications prior to visit.     Past Medical History:  Diagnosis Date   Asthma    HIV (human immunodeficiency virus infection) (HCC)      No past surgical history on file.     Review of Systems  Constitutional:  Negative for appetite change, chills, fatigue and fever.  HENT:  Negative for congestion, mouth sores, postnasal drip, rhinorrhea, sinus pain and sore throat.   Respiratory:  Negative for cough, shortness of breath and wheezing.   Cardiovascular:  Negative for chest pain.  Gastrointestinal:  Negative for abdominal pain, blood in stool, constipation, diarrhea, nausea and rectal pain.  Genitourinary:  Negative.   Musculoskeletal:  Negative for arthralgias and back pain.  Allergic/Immunologic: Negative for environmental allergies, food allergies and immunocompromised state.  Neurological: Negative.   Hematological:  Negative for adenopathy.  Psychiatric/Behavioral: Negative.       Objective:    BP 130/82   Pulse 82   Temp 97.6 F (36.4 C) (Oral)   Wt 168 lb (76.2 kg)   SpO2 98%   BMI 23.43 kg/m  Nursing note and vital signs reviewed.  Physical Exam Vitals reviewed.  Constitutional:      Appearance: Normal appearance. He is normal weight.  HENT:     Head: Normocephalic and atraumatic.  Eyes:     Extraocular Movements: Extraocular movements intact.     Conjunctiva/sclera: Conjunctivae normal.     Pupils: Pupils are equal, round, and reactive to light.  Cardiovascular:     Rate and Rhythm: Normal rate and regular rhythm.  Pulmonary:     Effort: Pulmonary effort is normal.     Breath sounds: Normal breath sounds.  Musculoskeletal:     Cervical back: Normal range of motion and neck supple.  Skin:    General: Skin is warm and dry.     Findings: Rash present.          Comments: Dry erythematous scaly plaque with excoriations, well demarcated,nttp, no exudate or induration.  See photos.   Neurological:     General: No focal deficit present.     Mental Status: He is alert and oriented to person, place, and time.  Psychiatric:        Mood and Affect: Mood normal.        Behavior: Behavior normal.        Thought Content: Thought content normal.        Judgment: Judgment normal.        Depression screen [facility] 2/9 10/14/2020 06/30/2020 03/14/2020 12/17/2019 10/16/2019  Decreased Interest 0 0 0 0 0  Down, Depressed, Hopeless 0 0 0 0 0  PHQ - 2 Score 0 0 0 0 0       Assessment & Plan:  DDX: tinea corporis vs eczema Lotrisone apply bid x 14 days Follow up if worse or not improved Sterilize shirts in warm water, change clothing while at work to stay dry in warm  environment  Patient Active Problem List   Diagnosis Date Noted   Healthcare maintenance 10/16/2019   Human immunodeficiency virus (HIV) disease (HCC) 07/31/2019     Problem List Items Addressed This Visit   None Visit Diagnoses     Dermatitis    -  Primary   Relevant Medications   clotrimazole-betamethasone (LOTRISONE) cream        I am having [employer] start on clotrimazole-betamethasone. I am also having him maintain his albuterol and Biktarvy.   Meds ordered this encounter  Medications   clotrimazole-betamethasone (LOTRISONE) cream    Sig: Apply 1 application topically 2 (two) times daily.    Dispense:  30 g    Refill:  0    Order Specific Question:   Supervising Provider    Answer:   VAN DAM, CORNELIUS N [[id number]]     Follow-up: Return if symptoms worsen or fail to improve.

## 2020-10-16 NOTE — Patient Instructions (Addendum)
Start lotrisone cream apply twice daily x 2 weeks I suspect rash is related to fungal infection or eczema related.  If your symptoms do not improve or worsen after 2 weeks of treatment please return for follow up. Try to keep skin clean and dry, sometimes best to keep 2 shirts available to change.  If fungal they thrive is warm wet environments.  Also sterilize shirts in hot water after wearing them.

## 2021-02-13 ENCOUNTER — Ambulatory Visit: Payer: BLUE CROSS/BLUE SHIELD | Admitting: Family

## 2021-03-02 ENCOUNTER — Ambulatory Visit (INDEPENDENT_AMBULATORY_CARE_PROVIDER_SITE_OTHER): Payer: BLUE CROSS/BLUE SHIELD | Admitting: Family

## 2021-03-02 ENCOUNTER — Other Ambulatory Visit: Payer: Self-pay

## 2021-03-02 ENCOUNTER — Encounter: Payer: Self-pay | Admitting: Family

## 2021-03-02 VITALS — BP 135/83 | HR 84 | Resp 16 | Ht 71.0 in | Wt 168.0 lb

## 2021-03-02 DIAGNOSIS — Z0000 Encounter for general adult medical examination without abnormal findings: Secondary | ICD-10-CM

## 2021-03-02 DIAGNOSIS — L309 Dermatitis, unspecified: Secondary | ICD-10-CM | POA: Diagnosis not present

## 2021-03-02 DIAGNOSIS — B20 Human immunodeficiency virus [HIV] disease: Secondary | ICD-10-CM

## 2021-03-02 DIAGNOSIS — Z113 Encounter for screening for infections with a predominantly sexual mode of transmission: Secondary | ICD-10-CM | POA: Diagnosis not present

## 2021-03-02 DIAGNOSIS — Z23 Encounter for immunization: Secondary | ICD-10-CM | POA: Diagnosis not present

## 2021-03-02 MED ORDER — CLOTRIMAZOLE-BETAMETHASONE 1-0.05 % EX CREA: 1.0000 "application " | TOPICAL_CREAM | Freq: Two times a day (BID) | CUTANEOUS | 1 refills | Status: DC

## 2021-03-02 MED ORDER — BIKTARVY 50-200-25 MG PO TABS: 1.0000 | ORAL_TABLET | Freq: Every day | ORAL | 5 refills | Status: DC

## 2021-03-02 NOTE — Patient Instructions (Addendum)
Nice to see you.  We will check your lab work today.  Continue to take your medication daily as prescribed.  Refills have been sent to the pharmacy.  Plan for follow up in 4 months or sooner if needed with lab work on the same day.  Have a great day and stay safe!  

## 2021-03-02 NOTE — Assessment & Plan Note (Signed)
·   Discussed importance of safe sexual practice and condom usage. Condoms offered.   STD testing today.  Influenza updated today.

## 2021-03-02 NOTE — Progress Notes (Signed)
Brief Narrative   Patient ID: Logan Jacobs, male    DOB: 2000-05-17, 21 y.o.   MRN: [id number]  Logan Jacobs is a 21 y/o AA male diagnosed with HIV in May of 2021 with risk factor of MSM. Initial CD4 nadir of 626 and viral load of 642,000. Genotype with Subtype B and no significant resistance patterns. No history of opportunistic infection. [id number] negative. Sole ART regimen of Biktarvy.   Subjective:    Chief Complaint  Patient presents with   Follow-up    b20    HPI:  Logan Jacobs is a 21 y.o. male with HIV disease last seen on 10/15/2018 with well-controlled virus and good adherence and tolerance to his ART regimen of Biktarvy.  Viral load at the time was undetectable with CD4 count of 810.  Here today for routine follow-up.  Logan Jacobs continues to take his Biktarvy daily as prescribed with no adverse side effects.  Overall feeling well today with no new concerns/complaints. Denies fevers, chills, night sweats, headaches, changes in vision, neck pain/stiffness, nausea, diarrhea, vomiting, lesions or rashes.  Excuse any Logan Jacobs has no problems obtaining medications from the pharmacy remains covered by [facility].  Denies feelings of being down, depressed, or hopeless recently.  Drinks alcohol on occasion and vapes vapes and consumes alcohol on occasion.  No current recreational or illicit drug use.  Condoms offered.  Healthcare maintenance due includes influenza vaccine.  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  No Known Allergies    Outpatient Medications Prior to Visit  Medication Sig Dispense Refill   albuterol (PROVENTIL HFA;VENTOLIN HFA) 108 (90 Base) MCG/ACT inhaler Inhale 2 puffs into the lungs every 4 (four) hours as needed for wheezing or shortness of breath. 1 Inhaler 1   bictegravir-emtricitabine-tenofovir AF (BIKTARVY) 50-200-25 MG TABS tablet Take 1 tablet by mouth daily. 30 tablet 5   clotrimazole-betamethasone (LOTRISONE) cream  Apply 1 application topically 2 (two) times daily. 30 g 0   No facility-administered medications prior to visit.     Past Medical History:  Diagnosis Date   Asthma    HIV (human immunodeficiency virus infection) (HCC)     History reviewed. No pertinent surgical history.   Review of Systems  Constitutional:  Negative for appetite change, chills, fatigue, fever and unexpected weight change.  Eyes:  Negative for visual disturbance.  Respiratory:  Negative for cough, chest tightness, shortness of breath and wheezing.   Cardiovascular:  Negative for chest pain and leg swelling.  Gastrointestinal:  Negative for abdominal pain, constipation, diarrhea, nausea and vomiting.  Genitourinary:  Negative for dysuria, flank pain, frequency, genital sores, hematuria and urgency.  Skin:  Negative for rash.  Allergic/Immunologic: Negative for immunocompromised state.  Neurological:  Negative for dizziness and headaches.     Objective:    BP 135/83    Pulse 84    Resp 16    Ht  (1.803 m)    Wt 168 lb (76.2 kg)    SpO2 99%    BMI 23.43 kg/m  Nursing note and vital signs reviewed.  Physical Exam Constitutional:      General: He is not in acute distress.    Appearance: He is well-developed.  Eyes:     Conjunctiva/sclera: Conjunctivae normal.  Cardiovascular:     Rate and Rhythm: Normal rate and regular rhythm.     Heart sounds: Normal heart sounds. No murmur heard.   No friction rub. No gallop.  Pulmonary:     Effort: Pulmonary effort is normal. No respiratory distress.     Breath sounds: Normal breath sounds. No wheezing or rales.  Chest:     Chest wall: No tenderness.  Abdominal:     General: Bowel sounds are normal.     Palpations: Abdomen is soft.     Tenderness: There is no abdominal tenderness.  Musculoskeletal:     Cervical back: Neck supple.  Lymphadenopathy:     Cervical: No cervical adenopathy.  Skin:    General: Skin is warm and dry.     Findings: No rash.   Neurological:     Mental Status: He is alert and oriented to person, place, and time.  Psychiatric:        Behavior: Behavior normal.        Thought Content: Thought content normal.        Judgment: Judgment normal.     Depression screen [facility] 2/9 03/02/2021 10/14/2020 06/30/2020 03/14/2020 12/17/2019  Decreased Interest 0 0 0 0 0  Down, Depressed, Hopeless - 0 0 0 0  PHQ - 2 Score 0 0 0 0 0       Assessment & Plan:    Patient Active Problem List   Diagnosis Date Noted   Healthcare maintenance 10/16/2019   Human immunodeficiency virus (HIV) disease (HCC) 07/31/2019     Problem List Items Addressed This Visit       Other   Human immunodeficiency virus (HIV) disease (HCC) - Primary    Mr. Hashagen continues to have well controlled virus with good adherence and tolerance to his ART regimen of Biktarvy. No signs/symptoms of opportunistic infection. Reviewed lab work and discussed plan of care. Continue current dose of Biktarvy. Check lab work today. Plan for follow up in 4 months or sooner if needed with lab work on the same day.       Relevant Medications   bictegravir-emtricitabine-tenofovir AF (BIKTARVY) 50-200-25 MG TABS tablet   clotrimazole-betamethasone (LOTRISONE) cream   Other Relevant Orders   COMPLETE METABOLIC PANEL WITH GFR   T-helper cell (CD4)- ([facility] clinic only)   HIV-1 RNA quant-no reflex-bld   Healthcare maintenance    Discussed importance of safe sexual practice and condom usage. Condoms offered.  STD testing today. Influenza updated today.       Other Visit Diagnoses     Dermatitis       Relevant Medications   clotrimazole-betamethasone (LOTRISONE) cream   Screening for STDs (sexually transmitted diseases)       Relevant Orders   Cytology (oral, anal, urethral) ancillary only   Cytology (oral, anal, urethral) ancillary only   RPR   Urine cytology ancillary only([place])        I am having Logan Jacobs maintain his albuterol, Biktarvy, and  clotrimazole-betamethasone.   Meds ordered this encounter  Medications   bictegravir-emtricitabine-tenofovir AF (BIKTARVY) 50-200-25 MG TABS tablet    Sig: Take 1 tablet by mouth daily.    Dispense:  30 tablet    Refill:  5    Order Specific Question:   Supervising Provider    Answer:   Drue Second, CYNTHIA [[id number]]   clotrimazole-betamethasone (LOTRISONE) cream    Sig: Apply 1 application topically 2 (two) times daily.    Dispense:  30 g    Refill:  1    Order Specific Question:   Supervising Provider    Answer:   Judyann Munson [[id number]]     Follow-up: Return in about 4 months (around  06/30/2021), or if symptoms worsen or fail to improve.   Terri Piedra, MSN, FNP-C Nurse Practitioner [facility] [place] number: [phone]

## 2021-03-02 NOTE — Assessment & Plan Note (Signed)
Logan Jacobs continues to have well controlled virus with good adherence and tolerance to his ART regimen of Biktarvy. No signs/symptoms of opportunistic infection. Reviewed lab work and discussed plan of care. Continue current dose of Biktarvy. Check lab work today. Plan for follow up in 4 months or sooner if needed with lab work on the same day.

## 2021-03-03 LAB — CYTOLOGY, (ORAL, ANAL, URETHRAL) ANCILLARY ONLY
Chlamydia: NEGATIVE
Chlamydia: NEGATIVE
Comment: NEGATIVE
Comment: NEGATIVE
Comment: NORMAL
Comment: NORMAL
Neisseria Gonorrhea: NEGATIVE
Neisseria Gonorrhea: NEGATIVE

## 2021-03-03 LAB — T-HELPER CELL (CD4) - (RCID CLINIC ONLY)
CD4 % Helper T Cell: 36 % (ref 33–65)
CD4 T Cell Abs: 857 /uL (ref 400–1790)

## 2021-03-03 LAB — URINE CYTOLOGY ANCILLARY ONLY
Chlamydia: NEGATIVE
Comment: NEGATIVE
Comment: NORMAL
Neisseria Gonorrhea: NEGATIVE

## 2021-03-04 LAB — COMPLETE METABOLIC PANEL WITH GFR
AG Ratio: 1.6 (calc) (ref 1.0–2.5)
ALT: 10 U/L (ref 9–46)
AST: 13 U/L (ref 10–40)
Albumin: 4.3 g/dL (ref 3.6–5.1)
Alkaline phosphatase (APISO): 66 U/L (ref 36–130)
BUN: 11 mg/dL (ref 7–25)
CO2: 29 mmol/L (ref 20–32)
Calcium: 9.9 mg/dL (ref 8.6–10.3)
Chloride: 107 mmol/L (ref 98–110)
Creat: 0.97 mg/dL (ref 0.60–1.24)
Globulin: 2.7 g/dL (calc) (ref 1.9–3.7)
Glucose, Bld: 83 mg/dL (ref 65–99)
Potassium: 3.9 mmol/L (ref 3.5–5.3)
Sodium: 141 mmol/L (ref 135–146)
Total Bilirubin: 0.7 mg/dL (ref 0.2–1.2)
Total Protein: 7 g/dL (ref 6.1–8.1)
eGFR: 113 mL/min/{1.73_m2} (ref >=60)

## 2021-03-04 LAB — HIV-1 RNA QUANT-NO REFLEX-BLD
HIV 1 RNA Quant: NOT DETECTED Copies/mL
HIV-1 RNA Quant, Log: NOT DETECTED Log cps/mL

## 2021-03-04 LAB — SYPHILIS: RPR W/REFLEX TO RPR TITER AND TREPONEMAL ANTIBODIES, TRADITIONAL SCREENING AND DIAGNOSIS ALGORITHM: RPR Ser Ql: NONREACTIVE

## 2021-03-05 ENCOUNTER — Other Ambulatory Visit: Payer: Self-pay

## 2021-03-05 ENCOUNTER — Encounter: Payer: Self-pay | Admitting: Family

## 2021-03-05 ENCOUNTER — Other Ambulatory Visit (HOSPITAL_COMMUNITY): Payer: Self-pay

## 2021-03-05 ENCOUNTER — Ambulatory Visit (INDEPENDENT_AMBULATORY_CARE_PROVIDER_SITE_OTHER): Payer: BLUE CROSS/BLUE SHIELD | Admitting: Family

## 2021-03-05 VITALS — BP 125/78 | HR 75 | Temp 97.4°F | Ht 71.0 in | Wt 175.0 lb

## 2021-03-05 DIAGNOSIS — Z113 Encounter for screening for infections with a predominantly sexual mode of transmission: Secondary | ICD-10-CM

## 2021-03-05 NOTE — Progress Notes (Signed)
Subjective:    Patient ID: Logan Jacobs, male    DOB: 2000/02/20, 21 y.o.   MRN: [id number]  Chief Complaint  Patient presents with   SEXUALLY TRANSMITTED DISEASE    Refused condoms    HPI:  Logan Jacobs is a 21 y.o. male with HIV disease last seen on 03/02/21 for routine follow up presenting today for an acute office visit.   Logan Jacobs has concern that he may have been exposed to STD on Tuesday evening and is requesting testing. Denies any current symptoms including fevers, chills, rashes, lesions, urinary symptoms, or drainage.   No Known Allergies    Outpatient Medications Prior to Visit  Medication Sig Dispense Refill   albuterol (PROVENTIL HFA;VENTOLIN HFA) 108 (90 Base) MCG/ACT inhaler Inhale 2 puffs into the lungs every 4 (four) hours as needed for wheezing or shortness of breath. 1 Inhaler 1   bictegravir-emtricitabine-tenofovir AF (BIKTARVY) 50-200-25 MG TABS tablet Take 1 tablet by mouth daily. 30 tablet 5   clotrimazole-betamethasone (LOTRISONE) cream Apply 1 application topically 2 (two) times daily. 30 g 1   No facility-administered medications prior to visit.     Past Medical History:  Diagnosis Date   Asthma    HIV (human immunodeficiency virus infection) ([place])      History reviewed. No pertinent surgical history.     Review of Systems  Constitutional:  Negative for appetite change, chills, fatigue, fever and unexpected weight change.  Eyes:  Negative for visual disturbance.  Respiratory:  Negative for cough, chest tightness, shortness of breath and wheezing.   Cardiovascular:  Negative for chest pain and leg swelling.  Gastrointestinal:  Negative for abdominal pain, constipation, diarrhea, nausea and vomiting.  Genitourinary:  Negative for dysuria, flank pain, frequency, genital sores, hematuria and urgency.  Skin:  Negative for rash.  Allergic/Immunologic: Negative for immunocompromised state.  Neurological:  Negative for dizziness and  headaches.     Objective:    BP 125/78    Pulse 75    Temp (!) 97.4 F (36.3 C) (Oral)    Ht 5\' 11"  (1.803 m)    Wt 175 lb (79.4 kg)    SpO2 96%    BMI 24.41 kg/m  Nursing note and vital signs reviewed.  Physical Exam Constitutional:      General: He is not in acute distress.    Appearance: He is well-developed.  Eyes:     Conjunctiva/sclera: Conjunctivae normal.  Cardiovascular:     Rate and Rhythm: Normal rate and regular rhythm.     Heart sounds: Normal heart sounds. No murmur heard.   No friction rub. No gallop.  Pulmonary:     Effort: Pulmonary effort is normal. No respiratory distress.     Breath sounds: Normal breath sounds. No wheezing or rales.  Chest:     Chest wall: No tenderness.  Abdominal:     General: Bowel sounds are normal.     Palpations: Abdomen is soft.     Tenderness: There is no abdominal tenderness.  Musculoskeletal:     Cervical back: Neck supple.  Lymphadenopathy:     Cervical: No cervical adenopathy.  Skin:    General: Skin is warm and dry.     Findings: No rash.  Neurological:     Mental Status: He is alert and oriented to person, place, and time.  Psychiatric:        Behavior: Behavior normal.        Thought Content: Thought content normal.  Judgment: Judgment normal.     Depression screen [facility] 2/9 03/05/2021 03/02/2021 10/14/2020 06/30/2020 03/14/2020  Decreased Interest 0 0 0 0 0  Down, Depressed, Hopeless 0 - 0 0 0  PHQ - 2 Score 0 0 0 0 0       Assessment & Plan:    Patient Active Problem List   Diagnosis Date Noted   Screening for venereal disease 03/05/2021   Healthcare maintenance 10/16/2019   Human immunodeficiency virus (HIV) disease ([place]) 07/31/2019     Problem List Items Addressed This Visit       Other   Screening for venereal disease - Primary    Logan Jacobs has concern for possible STD exposure and is requesting evaluation. No current symptoms. Check for STI today although may be early. Discussed importance of  safe sexual practice and condom usage. Condoms offered. Treatment pending lab work results as needed. Follow up if symptoms develop.       Relevant Orders   Cytology (oral, anal, urethral) ancillary only   Urine cytology ancillary only     I am having Teal Ouellette maintain his albuterol, Biktarvy, and clotrimazole-betamethasone.   Follow-up: Return if symptoms worsen or fail to improve.   Terri Piedra, MSN, FNP-C Nurse Practitioner [facility] for Infectious Disease [place] number: [phone]

## 2021-03-05 NOTE — Patient Instructions (Addendum)
Nice to see you.  We will check your lab work and treat any positive tests.   Continue to monitor for any symptoms.  Plan for follow up as scheduled.

## 2021-03-05 NOTE — Assessment & Plan Note (Signed)
Mr. Kress has concern for possible STD exposure and is requesting evaluation. No current symptoms. Check for STI today although may be early. Discussed importance of safe sexual practice and condom usage. Condoms offered. Treatment pending lab work results as needed. Follow up if symptoms develop.

## 2021-03-06 LAB — URINE CYTOLOGY ANCILLARY ONLY
Chlamydia: NEGATIVE
Comment: NEGATIVE
Comment: NORMAL
Neisseria Gonorrhea: NEGATIVE

## 2021-03-06 LAB — CYTOLOGY, (ORAL, ANAL, URETHRAL) ANCILLARY ONLY
Chlamydia: NEGATIVE
Comment: NEGATIVE
Comment: NORMAL
Neisseria Gonorrhea: NEGATIVE

## 2021-03-16 ENCOUNTER — Ambulatory Visit (HOSPITAL_COMMUNITY)
Admission: RE | Admit: 2021-03-16 | Discharge: 2021-03-16 | Disposition: A | Discharge status: Home / Self Care | Payer: BLUE CROSS/BLUE SHIELD | Source: Ambulatory Visit | Attending: Physician Assistant | Admitting: Physician Assistant

## 2021-03-16 ENCOUNTER — Encounter (HOSPITAL_COMMUNITY): Payer: Self-pay

## 2021-03-16 ENCOUNTER — Other Ambulatory Visit: Payer: Self-pay

## 2021-03-16 VITALS — BP 144/70 | HR 77 | Temp 98.2°F | Resp 18

## 2021-03-16 DIAGNOSIS — J029 Acute pharyngitis, unspecified: Secondary | ICD-10-CM | POA: Diagnosis not present

## 2021-03-16 LAB — POCT RAPID STREP A, ED / UC: Streptococcus, Group A Screen (Direct): NEGATIVE

## 2021-03-16 LAB — POCT INFECTIOUS MONO SCREEN, ED / UC: Mono Screen: NEGATIVE

## 2021-03-16 MED ORDER — CETIRIZINE HCL 10 MG PO TABS: 10.0000 mg | ORAL_TABLET | Freq: Every day | ORAL | 0 refills | Status: DC

## 2021-03-16 NOTE — ED Provider Notes (Signed)
[place]    CSN: [id number] Arrival date & time: 03/16/21  0803      History   Chief Complaint Chief Complaint  Patient presents with   Sore Throat    HPI Logan Jacobs is a 21 y.o. male.   Patient presents today with a several day history of sore throat.  Reports sore throat pain is rated 6 on a 0-10 pain scale, localized to posterior oropharynx, described as aching, worse with swallowing, no alleviating factors identified.  Denies any history of allergies but does have a history of asthma.  Denies additional symptoms including cough, congestion, fever, nausea, vomiting, chest pain, shortness of breath.  Denies any known sick contacts.  He has not had COVID or COVID vaccines.  He is eating and drinking normally.  He has not tried any over-the-counter medication for symptom management.  He does have a history of HIV but is taking Biktarvy as prescribed and lab work from last infectious disease clinic visit January 2023 showed he was undetectable with normal CD4 counts.  He denies any recent antibiotic use.   Past Medical History:  Diagnosis Date   Asthma    HIV (human immunodeficiency virus infection) [facility])     Patient Active Problem List   Diagnosis Date Noted   Screening for venereal disease 03/05/2021   Healthcare maintenance 10/16/2019   Human immunodeficiency virus (HIV) disease ([place]) 07/31/2019    History reviewed. No pertinent surgical history.     Home Medications    Prior to Admission medications   Medication Sig Start Date End Date Taking? Authorizing Provider  cetirizine (ZYRTEC ALLERGY) 10 MG tablet Take 1 tablet (10 mg total) by mouth at bedtime. 03/16/21  Yes Finnley Lewis K, PA-C  albuterol (PROVENTIL HFA;VENTOLIN HFA) 108 (90 Base) MCG/ACT inhaler Inhale 2 puffs into the lungs every 4 (four) hours as needed for wheezing or shortness of breath. 03/10/18   Lorin Glass, PA-C  bictegravir-emtricitabine-tenofovir AF (BIKTARVY) 50-200-25 MG  TABS tablet Take 1 tablet by mouth daily. 03/02/21   Golden Circle, FNP  clotrimazole-betamethasone (LOTRISONE) cream Apply 1 application topically 2 (two) times daily. 03/02/21   Golden Circle, FNP    Family History Family History  Problem Relation Age of Onset   Hypertension Mother    Healthy Father     Social History Social History   Tobacco Use   Smoking status: Former    Types: Cigarettes   Smokeless tobacco: Never  Vaping Use   Vaping Use: Some days  Substance Use Topics   Alcohol use: Yes    Comment: occ   Drug use: Never     Allergies   Patient has no known allergies.   Review of Systems Review of Systems  Constitutional:  Negative for activity change, appetite change, fatigue and fever.  HENT:  Positive for sore throat. Negative for congestion, sinus pressure, sneezing, trouble swallowing and voice change.   Respiratory:  Negative for cough and shortness of breath.   Cardiovascular:  Negative for chest pain.  Gastrointestinal:  Negative for abdominal pain, diarrhea, nausea and vomiting.  Neurological:  Negative for dizziness, light-headedness and headaches.    Physical Exam Triage Vital Signs ED Triage Vitals  Enc Vitals Group     BP 03/16/21 0825 (!) 144/70     Pulse Rate 03/16/21 0825 77     Resp 03/16/21 0825 18     Temp 03/16/21 0825 98.2 F (36.8 C)     Temp Source 03/16/21 0825  Oral     SpO2 03/16/21 0825 96 %     Weight --      Height --      Head Circumference --      Peak Flow --      Pain Score 03/16/21 0823 6     Pain Loc --      Pain Edu? --      Excl. in [place]? --    No data found.  Updated Vital Signs BP (!) 144/70 (BP Location: Right Arm)    Pulse 77    Temp 98.2 F (36.8 C) (Oral)    Resp 18    SpO2 96%   Visual Acuity Right Eye Distance:   Left Eye Distance:   Bilateral Distance:    Right Eye Near:   Left Eye Near:    Bilateral Near:     Physical Exam Vitals reviewed.  Constitutional:      General: He is  awake.     Appearance: Normal appearance. He is well-developed. He is not ill-appearing.     Comments: Very pleasant male appears stated age no acute distress sitting comfortably in exam room  HENT:     Head: Normocephalic and atraumatic.     Right Ear: Tympanic membrane, ear canal and external ear normal. Tympanic membrane is not erythematous or bulging.     Left Ear: Tympanic membrane, ear canal and external ear normal. Tympanic membrane is not erythematous or bulging.     Nose: Nose normal.     Mouth/Throat:     Pharynx: Uvula midline. Posterior oropharyngeal erythema present. No oropharyngeal exudate or uvula swelling.     Tonsils: No tonsillar exudate or tonsillar abscesses.  Cardiovascular:     Rate and Rhythm: Normal rate and regular rhythm.     Heart sounds: Normal heart sounds, S1 normal and S2 normal. No murmur heard. Pulmonary:     Effort: Pulmonary effort is normal. No accessory muscle usage or respiratory distress.     Breath sounds: Normal breath sounds. No stridor. No wheezing, rhonchi or rales.     Comments: Clear to auscultation bilaterally Abdominal:     General: Bowel sounds are normal.     Palpations: Abdomen is soft.     Tenderness: There is no abdominal tenderness.  Lymphadenopathy:     Head:     Right side of head: No submental, submandibular or tonsillar adenopathy.     Left side of head: No submental, submandibular or tonsillar adenopathy.     Cervical: No cervical adenopathy.  Neurological:     Mental Status: He is alert.  Psychiatric:        Behavior: Behavior is cooperative.     UC Treatments / Results  Labs (all labs ordered are listed, but only abnormal results are displayed) Labs Reviewed  CULTURE, GROUP A STREP [facility])  POCT RAPID STREP A, ED / UC  POCT INFECTIOUS MONO SCREEN, ED / UC    EKG   Radiology No results found.  Procedures Procedures (including critical care time)  Medications Ordered in UC Medications - No data to  display  Initial Impression / Assessment and Plan / UC Course  I have reviewed the triage vital signs and the nursing notes.  Pertinent labs & imaging results that were available during my care of the patient were reviewed by me and considered in my medical decision making (see chart for details).     Vital signs and physical exam reassuring today; no indication for emergent  evaluation or imaging.  Strep testing was negative in clinic today.  Throat culture is pending but will defer antibiotics until results are obtained.  Monospot was negative.  No indication for additional viral testing as patient denies additional URI symptoms.  Discussed symptoms could be related to viral pharyngitis versus allergies.  He was prescribed Zyrtec daily and encouraged to use conservative treatment measures including gargling with warm salt water and humidifier at home for symptom relief.  He can use Tylenol and ibuprofen as needed.  Discussed that if he has any worsening symptoms including difficulty speaking, difficulty swallowing, swelling of his throat, muffled voice he needs to go to the emergency room or be seen immediately.  Strict return precautions given.  Work excuse note provided.  Final Clinical Impressions(s) / UC Diagnoses   Final diagnoses:  Viral pharyngitis  Sore throat     Discharge Instructions      Your mono and strep test were negative.  We will contact you if your throat result is positive you will need to start any antibiotics.  Alternate Tylenol ibuprofen for pain.  Gargle with warm salt water.  Begin allergy medication including Zyrtec daily to help with your symptoms.  If you have any difficulty swallowing, difficulty speaking, swelling of your throat, shortness of breath, muffled voice, high fever you need to be seen immediately.  If symptoms or not improving within a week please return here or see your PCP.     ED Prescriptions     Medication Sig Dispense Auth. Provider    cetirizine (ZYRTEC ALLERGY) 10 MG tablet Take 1 tablet (10 mg total) by mouth at bedtime. 15 tablet Tavi Hoogendoorn, Derry Skill, PA-C      PDMP not reviewed this encounter.   Terrilee Croak, PA-C 03/16/21 [id number]

## 2021-03-16 NOTE — ED Notes (Signed)
Strep swab in lab 

## 2021-03-16 NOTE — Discharge Instructions (Addendum)
Your mono and strep test were negative.  We will contact you if your throat result is positive you will need to start any antibiotics.  Alternate Tylenol ibuprofen for pain.  Gargle with warm salt water.  Begin allergy medication including Zyrtec daily to help with your symptoms.  If you have any difficulty swallowing, difficulty speaking, swelling of your throat, shortness of breath, muffled voice, high fever you need to be seen immediately.  If symptoms or not improving within a week please return here or see your PCP.

## 2021-03-16 NOTE — ED Triage Notes (Signed)
Onset of sore throat was 03/11/2021.  Denies fever or any other symptoms.

## 2021-03-18 LAB — CULTURE, GROUP A STREP (THRC)

## 2021-03-23 ENCOUNTER — Other Ambulatory Visit (HOSPITAL_COMMUNITY): Payer: Self-pay

## 2021-03-25 ENCOUNTER — Other Ambulatory Visit (HOSPITAL_COMMUNITY): Payer: Self-pay

## 2021-03-25 NOTE — Telephone Encounter (Signed)
I called the patient back.

## 2021-03-30 ENCOUNTER — Other Ambulatory Visit (HOSPITAL_COMMUNITY): Payer: Self-pay

## 2021-04-03 NOTE — Telephone Encounter (Signed)
Logan Jacobs spoke with patient.   [name], RN

## 2021-04-09 ENCOUNTER — Other Ambulatory Visit: Payer: Self-pay | Admitting: Pharmacist

## 2021-04-09 DIAGNOSIS — B20 Human immunodeficiency virus [HIV] disease: Secondary | ICD-10-CM

## 2021-04-09 MED ORDER — BIKTARVY 50-200-25 MG PO TABS: 1.0000 | ORAL_TABLET | Freq: Every day | ORAL | 0 refills | Status: AC

## 2021-04-09 NOTE — Progress Notes (Signed)
Medication Samples have been provided to the patient. ? ?Drug name: Biktarvy        ?Strength: 50/200/25 mg       ?Qty: 14 tablets (2 Bottles) ?LOT: [id number]   ?Exp.Date: 11/09/2022 ? ?Dosing instructions: Take one tablet by mouth once daily ? ?The patient has been instructed regarding the correct time, dose, and frequency of taking this medication, including desired effects and most common side effects.  ? ?Alyzabeth Pontillo L. Sorina Derrig, PharmD, BCIDP, AAHIVP, CPP ?Clinical Pharmacist Practitioner ?Infectious Diseases Clinical Pharmacist ?[facility] ?01/21/2020, 10:07 AM  ?

## 2021-04-30 ENCOUNTER — Other Ambulatory Visit (HOSPITAL_COMMUNITY): Payer: Self-pay

## 2021-05-01 ENCOUNTER — Other Ambulatory Visit: Payer: Self-pay | Admitting: Pharmacist

## 2021-05-01 DIAGNOSIS — B20 Human immunodeficiency virus [HIV] disease: Secondary | ICD-10-CM

## 2021-05-01 MED ORDER — BICTEGRAVIR-EMTRICITAB-TENOFOV 50-200-25 MG PO TABS: 1.0000 | ORAL_TABLET | Freq: Every day | ORAL | 0 refills | Status: AC

## 2021-05-01 NOTE — Progress Notes (Signed)
Medication Samples have been provided to the patient.  Drug name: Biktarvy        Strength: 50/200/25 mg       Qty: 7 tablets (1 bottles) LOT: [id number]   Exp.Date: 10/24  Dosing instructions: Take one tablet by mouth once daily  The patient has been instructed regarding the correct time, dose, and frequency of taking this medication, including desired effects and most common side effects.   Renai Lopata, PharmD, CPP Clinical Pharmacist Practitioner Infectious Diseases Clinical Pharmacist [facility]  

## 2021-06-29 ENCOUNTER — Ambulatory Visit: Payer: BLUE CROSS/BLUE SHIELD | Admitting: Family

## 2021-07-01 ENCOUNTER — Ambulatory Visit (INDEPENDENT_AMBULATORY_CARE_PROVIDER_SITE_OTHER): Payer: Self-pay | Admitting: Family

## 2021-07-01 ENCOUNTER — Other Ambulatory Visit (HOSPITAL_COMMUNITY): Payer: Self-pay

## 2021-07-01 ENCOUNTER — Other Ambulatory Visit: Payer: Self-pay

## 2021-07-01 ENCOUNTER — Other Ambulatory Visit (HOSPITAL_COMMUNITY)
Admission: RE | Admit: 2021-07-01 | Discharge: 2021-07-01 | Disposition: A | Discharge status: Home / Self Care | Payer: Commercial Managed Care - HMO | Source: Ambulatory Visit | Attending: Family | Admitting: Family

## 2021-07-01 ENCOUNTER — Telehealth: Payer: Self-pay

## 2021-07-01 ENCOUNTER — Encounter: Payer: Self-pay | Admitting: Family

## 2021-07-01 VITALS — BP 149/91 | HR 87 | Temp 98.4°F | Wt 186.0 lb

## 2021-07-01 DIAGNOSIS — Z113 Encounter for screening for infections with a predominantly sexual mode of transmission: Secondary | ICD-10-CM | POA: Diagnosis present

## 2021-07-01 DIAGNOSIS — Z0000 Encounter for general adult medical examination without abnormal findings: Secondary | ICD-10-CM

## 2021-07-01 DIAGNOSIS — B20 Human immunodeficiency virus [HIV] disease: Secondary | ICD-10-CM

## 2021-07-01 DIAGNOSIS — L309 Dermatitis, unspecified: Secondary | ICD-10-CM

## 2021-07-01 MED ORDER — BIKTARVY 50-200-25 MG PO TABS: 1.0000 | ORAL_TABLET | Freq: Every day | ORAL | 5 refills | Status: DC

## 2021-07-01 MED ORDER — CLOTRIMAZOLE-BETAMETHASONE 1-0.05 % EX CREA: 1.0000 "application " | TOPICAL_CREAM | Freq: Two times a day (BID) | CUTANEOUS | 1 refills | Status: DC

## 2021-07-01 NOTE — Progress Notes (Signed)
Brief Narrative   Patient ID: Logan Jacobs, male    DOB: 2000/05/12, 21 y.o.   MRN: [id number]  Logan Jacobs is a 21 y/o AA male diagnosed with HIV in May of 2021 with risk factor of MSM. Initial CD4 nadir of 626 and viral load of 642,000. Genotype with Subtype B and no significant resistance patterns. No history of opportunistic infection. [id number] negative. Sole ART regimen of Biktarvy.   Subjective:    Chief Complaint  Patient presents with   Follow-up   HIV Positive/AIDS    HPI:  Logan Jacobs is a 22 y.o. male with HIV disease last seen on 03/02/21 with well controlled virus and good adherence and tolerance to his ART regimen of Biktarvy. Viral load was undetectable and CD4 count 857. STD testing negative for gonorrhea, chlamydia, and syphilis. Kidney function, liver function and electrolytes within normal ranges. Here today for routine follow up.  Logan Jacobs continues to take his Biktarvy daily as prescribed with no adverse side effects.  Overall feeling well today with no new concerns/complaints.  Would like refill of his Lotrisone cream. Denies fevers, chills, night sweats, headaches, changes in vision, neck pain/stiffness, nausea, diarrhea, vomiting, lesions or rashes.  Logan Jacobs has no problems obtaining medication from the pharmacy and is now covered by [place].  Denies feelings of being down, depressed, or hopeless recently.  Drinks alcohol on occasion with no current recreational/illicit drug use and some day tobacco use.  Condoms offered.  Requesting STD testing. Due for routine dental care.    No Known Allergies    Outpatient Medications Prior to Visit  Medication Sig Dispense Refill   albuterol (PROVENTIL HFA;VENTOLIN HFA) 108 (90 Base) MCG/ACT inhaler Inhale 2 puffs into the lungs every 4 (four) hours as needed for wheezing or shortness of breath. 1 Inhaler 1   bictegravir-emtricitabine-tenofovir AF (BIKTARVY) 50-200-25 MG TABS tablet Take 1 tablet by mouth daily. 30  tablet 5   cetirizine (ZYRTEC ALLERGY) 10 MG tablet Take 1 tablet (10 mg total) by mouth at bedtime. 15 tablet 0   clotrimazole-betamethasone (LOTRISONE) cream Apply 1 application topically 2 (two) times daily. (Patient not taking: Reported on 07/01/2021) 30 g 1   No facility-administered medications prior to visit.     Past Medical History:  Diagnosis Date   Asthma    HIV (human immunodeficiency virus infection) ([facility])      History reviewed. No pertinent surgical history.    Review of Systems  Constitutional:  Negative for appetite change, chills, fatigue, fever and unexpected weight change.  Eyes:  Negative for visual disturbance.  Respiratory:  Negative for cough, chest tightness, shortness of breath and wheezing.   Cardiovascular:  Negative for chest pain and leg swelling.  Gastrointestinal:  Negative for abdominal pain, constipation, diarrhea, nausea and vomiting.  Genitourinary:  Negative for dysuria, flank pain, frequency, genital sores, hematuria and urgency.  Skin:  Negative for rash.  Allergic/Immunologic: Negative for immunocompromised state.  Neurological:  Negative for dizziness and headaches.     Objective:    BP (!) 149/91   Pulse 87   Temp 98.4 F (36.9 C) (Oral)   Wt 186 lb (84.4 kg)   SpO2 99%   BMI 25.94 kg/m  Nursing note and vital signs reviewed.  Physical Exam Constitutional:      General: He is not in acute distress.    Appearance: He is well-developed.  Eyes:     Conjunctiva/sclera: Conjunctivae normal.  Cardiovascular:     Rate and  Rhythm: Normal rate and regular rhythm.     Heart sounds: Normal heart sounds. No murmur heard.   No friction rub. No gallop.  Pulmonary:     Effort: Pulmonary effort is normal. No respiratory distress.     Breath sounds: Normal breath sounds. No wheezing or rales.  Chest:     Chest wall: No tenderness.  Abdominal:     General: Bowel sounds are normal.     Palpations: Abdomen is soft.     Tenderness: There  is no abdominal tenderness.  Musculoskeletal:     Cervical back: Neck supple.  Lymphadenopathy:     Cervical: No cervical adenopathy.  Skin:    General: Skin is warm and dry.     Findings: No rash.  Neurological:     Mental Status: He is alert and oriented to person, place, and time.  Psychiatric:        Behavior: Behavior normal.        Thought Content: Thought content normal.        Judgment: Judgment normal.        03/05/2021   11:48 AM 03/02/2021    3:05 PM 10/14/2020    2:21 PM 06/30/2020   10:14 AM 03/14/2020   10:07 AM  Depression screen PHQ 2/9  Decreased Interest 0 0 0 0 0  Down, Depressed, Hopeless 0  0 0 0  PHQ - 2 Score 0 0 0 0 0       Assessment & Plan:    Patient Active Problem List   Diagnosis Date Noted   Screening for venereal disease 03/05/2021   Healthcare maintenance 10/16/2019   Human immunodeficiency virus (HIV) disease ([facility]) 07/31/2019     Problem List Items Addressed This Visit       Other   Human immunodeficiency virus (HIV) disease ([facility]) - Primary    Logan Jacobs continues to have well-controlled virus with good adherence and tolerance to Biktarvy.  We reviewed previous lab work and discussed plan of care.  Check blood work today.  No longer in need of financial assistance now with [employer].  Continue current dose of Biktarvy.  Check blood work today.  Plan for follow-up in 4 months or sooner if needed with lab work on the same day.       Relevant Medications   clotrimazole-betamethasone (LOTRISONE) cream   bictegravir-emtricitabine-tenofovir AF (BIKTARVY) 50-200-25 MG TABS tablet   Other Relevant Orders   Comprehensive metabolic panel   HIV-1 RNA quant-no reflex-bld   T-helper cell (CD4)- ([facility] clinic only)   Healthcare maintenance    Discussed importance of safe sexual practices and condom use.  Condoms offered. Encouraged to complete routine dental care with commercial insurance.  [facility].  Routine vaccinations up to  date.  STD testing per request.       Other Visit Diagnoses     Screening for STDs (sexually transmitted diseases)       Relevant Orders   RPR   Cytology (oral, anal, urethral) ancillary only   Urine cytology ancillary only   Dermatitis       Relevant Medications   clotrimazole-betamethasone (LOTRISONE) cream        I have changed Logan Jacobs's clotrimazole-betamethasone. I am also having him maintain his albuterol, cetirizine, and Biktarvy.   Meds ordered this encounter  Medications   clotrimazole-betamethasone (LOTRISONE) cream    Sig: Apply 1 application. topically 2 (two) times daily.    Dispense:  30 g  Refill:  1    Order Specific Question:   Supervising Provider    Answer:   Drue Second, CYNTHIA [[id number]]   bictegravir-emtricitabine-tenofovir AF (BIKTARVY) 50-200-25 MG TABS tablet    Sig: Take 1 tablet by mouth daily.    Dispense:  30 tablet    Refill:  5    Order Specific Question:   Supervising Provider    Answer:   Judyann Munson [[id number]]     Follow-up: Return in about 4 months (around 11/01/2021), or if symptoms worsen or fail to improve.   Marcos Eke, MSN, FNP-C Nurse Practitioner [facility] for Infectious Disease [facility] Main number: [phone]

## 2021-07-01 NOTE — Telephone Encounter (Signed)
error 

## 2021-07-01 NOTE — Assessment & Plan Note (Addendum)
   Discussed importance of safe sexual practices and condom use.  Condoms offered.  Encouraged to complete routine dental care with commercial insurance.  Can refer to [facility] as needed.   Routine vaccinations up to date.   STD testing per request.

## 2021-07-01 NOTE — Assessment & Plan Note (Signed)
Mr. Dinneen continues to have well-controlled virus with good adherence and tolerance to [employer].  We reviewed previous lab work and discussed plan of care.  Check blood work today.  No longer in need of financial assistance now with [employer].  Continue current dose of Biktarvy.  Check blood work today.  Plan for follow-up in 4 months or sooner if needed with lab work on the same day.

## 2021-07-01 NOTE — Patient Instructions (Addendum)
Nice to see you.  We will check your lab work today.  Continue to take your medication daily as prescribed.  Refills have been sent to the pharmacy.  Plan for follow up in 4 months or sooner if needed with lab work on the same day.  Have a great day and stay safe!  

## 2021-07-02 LAB — CYTOLOGY, (ORAL, ANAL, URETHRAL) ANCILLARY ONLY
Chlamydia: NEGATIVE
Comment: NEGATIVE
Comment: NORMAL
Neisseria Gonorrhea: NEGATIVE

## 2021-07-02 LAB — URINE CYTOLOGY ANCILLARY ONLY
Chlamydia: NEGATIVE
Comment: NEGATIVE
Comment: NORMAL
Neisseria Gonorrhea: NEGATIVE

## 2021-07-03 ENCOUNTER — Ambulatory Visit: Payer: BLUE CROSS/BLUE SHIELD | Admitting: Family

## 2021-07-03 LAB — HIV-1 RNA QUANT-NO REFLEX-BLD
HIV 1 RNA Quant: NOT DETECTED copies/mL
HIV-1 RNA Quant, Log: NOT DETECTED Log copies/mL

## 2021-07-03 LAB — COMPREHENSIVE METABOLIC PANEL
AG Ratio: 1.9 (calc) (ref 1.0–2.5)
ALT: 16 U/L (ref 9–46)
AST: 18 U/L (ref 10–40)
Albumin: 5 g/dL (ref 3.6–5.1)
Alkaline phosphatase (APISO): 73 U/L (ref 36–130)
BUN: 9 mg/dL (ref 7–25)
CO2: 27 mmol/L (ref 20–32)
Calcium: 10.3 mg/dL (ref 8.6–10.3)
Chloride: 103 mmol/L (ref 98–110)
Creat: 1.14 mg/dL (ref 0.60–1.24)
Globulin: 2.6 g/dL (calc) (ref 1.9–3.7)
Glucose, Bld: 87 mg/dL (ref 65–99)
Potassium: 3.9 mmol/L (ref 3.5–5.3)
Sodium: 140 mmol/L (ref 135–146)
Total Bilirubin: 0.7 mg/dL (ref 0.2–1.2)
Total Protein: 7.6 g/dL (ref 6.1–8.1)

## 2021-07-03 LAB — T-HELPER CELL (CD4) - (RCID CLINIC ONLY)
CD4 % Helper T Cell: 38 % (ref 33–65)
CD4 T Cell Abs: 488 /uL (ref 400–1790)

## 2021-07-03 LAB — RPR: RPR Ser Ql: NONREACTIVE

## 2021-07-03 NOTE — Telephone Encounter (Signed)
error 

## 2021-07-27 ENCOUNTER — Ambulatory Visit: Payer: BLUE CROSS/BLUE SHIELD | Admitting: Family

## 2021-12-07 ENCOUNTER — Other Ambulatory Visit (HOSPITAL_COMMUNITY)
Admission: RE | Admit: 2021-12-07 | Discharge: 2021-12-07 | Disposition: A | Discharge status: Home / Self Care | Payer: Commercial Managed Care - HMO | Source: Ambulatory Visit | Attending: Family | Admitting: Family

## 2021-12-07 ENCOUNTER — Other Ambulatory Visit: Payer: Self-pay

## 2021-12-07 ENCOUNTER — Ambulatory Visit (INDEPENDENT_AMBULATORY_CARE_PROVIDER_SITE_OTHER): Payer: Commercial Managed Care - HMO | Admitting: Family

## 2021-12-07 ENCOUNTER — Encounter: Payer: Self-pay | Admitting: Family

## 2021-12-07 VITALS — BP 128/82 | HR 66 | Temp 98.1°F | Ht 71.0 in | Wt 181.0 lb

## 2021-12-07 DIAGNOSIS — B20 Human immunodeficiency virus [HIV] disease: Secondary | ICD-10-CM

## 2021-12-07 DIAGNOSIS — Z113 Encounter for screening for infections with a predominantly sexual mode of transmission: Secondary | ICD-10-CM | POA: Insufficient documentation

## 2021-12-07 DIAGNOSIS — Z0000 Encounter for general adult medical examination without abnormal findings: Secondary | ICD-10-CM | POA: Diagnosis not present

## 2021-12-07 DIAGNOSIS — Z23 Encounter for immunization: Secondary | ICD-10-CM | POA: Diagnosis not present

## 2021-12-07 MED ORDER — BIKTARVY 50-200-25 MG PO TABS: 1.0000 | ORAL_TABLET | Freq: Every day | ORAL | 6 refills | Status: DC

## 2021-12-07 NOTE — Assessment & Plan Note (Signed)
   Influenza updated  Discussed importance of safe sexual practices, family-planning, and condom use.  Condoms and STD testing offered.  Encouraged to complete routine dental care and can refer to dental clinic if necessary.

## 2021-12-07 NOTE — Assessment & Plan Note (Signed)
Logan Jacobs continues to have well-controlled virus with good adherence and tolerance to [employer].  Reviewed previous lab work and discussed plan of care.  Continue current dose of Biktarvy.  Check lab work today.  Plan for follow-up in 6 months or sooner if needed with lab work on the same day.

## 2021-12-07 NOTE — Patient Instructions (Signed)
Nice to see you. ? ?We will check your lab work today. ? ?Continue to take your medication daily as prescribed. ? ?Refills have been sent to the pharmacy. ? ?Plan for follow up in 6 months or sooner if needed with lab work on the same day. ? ?Have a great day and stay safe! ? ?

## 2021-12-07 NOTE — Progress Notes (Signed)
Brief Narrative   Patient ID: Logan Jacobs, male    DOB: [birth year], 21 y.o.   MRN: [id number]  Logan Jacobs is a 21 y/o AA male diagnosed with HIV in May of 2021 with risk factor of MSM. Initial CD4 nadir of 626 and viral load of 642,000. Genotype with Subtype B and no significant resistance patterns. No history of opportunistic infection. [id number] negative. Sole ART regimen of Biktarvy.     Subjective:    Chief Complaint  Patient presents with   Follow-up    HPI:  Logan Jacobs is a 21 y.o. male with HIV disease last seen on 07/01/21 with well-controlled virus and good adherence and tolerance to [employer].  Viral load was undetectable with CD4 count of 488.  Kidney function, liver function, electrolytes within normal ranges.  STD testing negative for gonorrhea, chlamydia, and syphilis.  Here today for routine follow-up.  Logan Jacobs been doing well since his last office visit and continues to take his Biktarvy as prescribed with no adverse side effects and no problems obtaining medication from the pharmacy.  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  No new concerns/complaints.  Healthcare maintenance due includes influenza vaccine and routine dental care.  Condoms offered.  Requesting STD testing.  Denies fevers, chills, night sweats, headaches, changes in vision, neck pain/stiffness, nausea, diarrhea, vomiting, lesions or rashes.  No Known Allergies    Outpatient Medications Prior to Visit  Medication Sig Dispense Refill   albuterol (PROVENTIL HFA;VENTOLIN HFA) 108 (90 Base) MCG/ACT inhaler Inhale 2 puffs into the lungs every 4 (four) hours as needed for wheezing or shortness of breath. 1 Inhaler 1   clotrimazole-betamethasone (LOTRISONE) cream Apply 1 application. topically 2 (two) times daily. 30 g 1   bictegravir-emtricitabine-tenofovir AF (BIKTARVY) 50-200-25 MG TABS tablet Take 1 tablet by mouth daily. 30 tablet 5   No facility-administered  medications prior to visit.     Past Medical History:  Diagnosis Date   Asthma    HIV (human immunodeficiency virus infection) (HCC)      History reviewed. No pertinent surgical history.    Review of Systems  Constitutional:  Negative for appetite change, chills, fatigue, fever and unexpected weight change.  Eyes:  Negative for visual disturbance.  Respiratory:  Negative for cough, chest tightness, shortness of breath and wheezing.   Cardiovascular:  Negative for chest pain and leg swelling.  Gastrointestinal:  Negative for abdominal pain, constipation, diarrhea, nausea and vomiting.  Genitourinary:  Negative for dysuria, flank pain, frequency, genital sores, hematuria and urgency.  Skin:  Negative for rash.  Allergic/Immunologic: Negative for immunocompromised state.  Neurological:  Negative for dizziness and headaches.      Objective:    BP 128/82   Pulse 66   Temp 98.1 F (36.7 C) (Oral)   Ht 5\' 11"  (1.803 m)   Wt 181 lb (82.1 kg)   SpO2 99%   BMI 25.24 kg/m  Nursing note and vital signs reviewed.  Physical Exam Constitutional:      General: He is not in acute distress.    Appearance: He is well-developed.  Eyes:     Conjunctiva/sclera: Conjunctivae normal.  Cardiovascular:     Rate and Rhythm: Normal rate and regular rhythm.     Heart sounds: Normal heart sounds. No murmur heard.    No friction rub. No gallop.  Pulmonary:     Effort: Pulmonary effort is normal. No respiratory distress.     Breath sounds:  Normal breath sounds. No wheezing or rales.  Chest:     Chest wall: No tenderness.  Abdominal:     General: Bowel sounds are normal.     Palpations: Abdomen is soft.     Tenderness: There is no abdominal tenderness.  Musculoskeletal:     Cervical back: Neck supple.  Lymphadenopathy:     Cervical: No cervical adenopathy.  Skin:    General: Skin is warm and dry.     Findings: No rash.  Neurological:     Mental Status: He is alert and oriented to  person, place, and time.  Psychiatric:        Behavior: Behavior normal.        Thought Content: Thought content normal.        Judgment: Judgment normal.         12/07/2021    9:11 AM 03/05/2021   11:48 AM 03/02/2021    3:05 PM 10/14/2020    2:21 PM 06/30/2020   10:14 AM  Depression screen PHQ 2/9  Decreased Interest 0 0 0 0 0  Down, Depressed, Hopeless 0 0  0 0  PHQ - 2 Score 0 0 0 0 0       Assessment & Plan:    Patient Active Problem List   Diagnosis Date Noted   Screening for venereal disease 03/05/2021   Healthcare maintenance 10/16/2019   Human immunodeficiency virus (HIV) disease ([place]) 07/31/2019     Problem List Items Addressed This Visit       Other   Human immunodeficiency virus (HIV) disease ([place]) - Primary    Logan Jacobs continues to have well-controlled virus with good adherence and tolerance to Biktarvy.  Reviewed previous lab work and discussed plan of care.  Continue current dose of Biktarvy.  Check lab work today.  Plan for follow-up in 6 months or sooner if needed with lab work on the same day.      Relevant Medications   bictegravir-emtricitabine-tenofovir AF (BIKTARVY) 50-200-25 MG TABS tablet   Other Relevant Orders   Comprehensive metabolic panel   HIV-1 RNA quant-no reflex-bld   T-helper cell (CD4)- ([facility] clinic only)   Healthcare maintenance    Influenza updated Discussed importance of safe sexual practices, family-planning, and condom use.  Condoms and STD testing offered. Encouraged to complete routine dental care and can refer to dental clinic if necessary.      Other Visit Diagnoses     Screening for STDs (sexually transmitted diseases)       Relevant Orders   RPR   Urine cytology ancillary only   Cytology (oral, anal, urethral) ancillary only   Cytology (oral, anal, urethral) ancillary only        I am having Logan Jacobs maintain his albuterol, clotrimazole-betamethasone, and Biktarvy.   Meds ordered this encounter   Medications   bictegravir-emtricitabine-tenofovir AF (BIKTARVY) 50-200-25 MG TABS tablet    Sig: Take 1 tablet by mouth daily.    Dispense:  30 tablet    Refill:  6    Order Specific Question:   Supervising Provider    Answer:   Carlyle Basques [[id number]]     Follow-up: Return in about 6 months (around 06/08/2022), or if symptoms worsen or fail to improve.   Terri Piedra, MSN, FNP-C Nurse Practitioner [facility] for Infectious Disease [place] number: [phone]

## 2021-12-08 LAB — CYTOLOGY, (ORAL, ANAL, URETHRAL) ANCILLARY ONLY
Chlamydia: NEGATIVE
Chlamydia: NEGATIVE
Comment: NEGATIVE
Comment: NEGATIVE
Comment: NORMAL
Comment: NORMAL
Neisseria Gonorrhea: NEGATIVE
Neisseria Gonorrhea: NEGATIVE

## 2021-12-08 LAB — URINE CYTOLOGY ANCILLARY ONLY
Chlamydia: NEGATIVE
Comment: NEGATIVE
Comment: NORMAL
Neisseria Gonorrhea: NEGATIVE

## 2021-12-08 LAB — T-HELPER CELL (CD4) - (RCID CLINIC ONLY)
CD4 % Helper T Cell: 39 % (ref 33–65)
CD4 T Cell Abs: 892 /uL (ref 400–1790)

## 2021-12-09 LAB — HIV-1 RNA QUANT-NO REFLEX-BLD
HIV 1 RNA Quant: NOT DETECTED Copies/mL
HIV-1 RNA Quant, Log: NOT DETECTED Log cps/mL

## 2021-12-09 LAB — COMPREHENSIVE METABOLIC PANEL
AG Ratio: 1.8 (calc) (ref 1.0–2.5)
ALT: 15 U/L (ref 9–46)
AST: 16 U/L (ref 10–40)
Albumin: 4.4 g/dL (ref 3.6–5.1)
Alkaline phosphatase (APISO): 70 U/L (ref 36–130)
BUN: 10 mg/dL (ref 7–25)
CO2: 28 mmol/L (ref 20–32)
Calcium: 9.7 mg/dL (ref 8.6–10.3)
Chloride: 106 mmol/L (ref 98–110)
Creat: 0.99 mg/dL (ref 0.60–1.24)
Globulin: 2.4 g/dL (calc) (ref 1.9–3.7)
Glucose, Bld: 86 mg/dL (ref 65–99)
Potassium: 3.9 mmol/L (ref 3.5–5.3)
Sodium: 143 mmol/L (ref 135–146)
Total Bilirubin: 0.7 mg/dL (ref 0.2–1.2)
Total Protein: 6.8 g/dL (ref 6.1–8.1)

## 2021-12-09 LAB — RPR: RPR Ser Ql: NONREACTIVE

## 2022-01-26 ENCOUNTER — Other Ambulatory Visit: Payer: Self-pay | Admitting: Family

## 2022-02-08 DIAGNOSIS — Z419 Encounter for procedure for purposes other than remedying health state, unspecified: Secondary | ICD-10-CM | POA: Diagnosis not present

## 2022-03-11 DIAGNOSIS — Z419 Encounter for procedure for purposes other than remedying health state, unspecified: Secondary | ICD-10-CM | POA: Diagnosis not present

## 2022-04-09 DIAGNOSIS — Z419 Encounter for procedure for purposes other than remedying health state, unspecified: Secondary | ICD-10-CM | POA: Diagnosis not present

## 2022-05-05 ENCOUNTER — Other Ambulatory Visit (HOSPITAL_COMMUNITY): Payer: Self-pay

## 2022-05-10 DIAGNOSIS — Z419 Encounter for procedure for purposes other than remedying health state, unspecified: Secondary | ICD-10-CM | POA: Diagnosis not present

## 2022-06-01 ENCOUNTER — Other Ambulatory Visit (HOSPITAL_COMMUNITY)
Admission: RE | Admit: 2022-06-01 | Discharge: 2022-06-01 | Disposition: A | Discharge status: Home / Self Care | Payer: Commercial Managed Care - HMO | Source: Ambulatory Visit | Attending: Family | Admitting: Family

## 2022-06-01 ENCOUNTER — Encounter: Payer: Self-pay | Admitting: Family

## 2022-06-01 ENCOUNTER — Ambulatory Visit (INDEPENDENT_AMBULATORY_CARE_PROVIDER_SITE_OTHER): Payer: Commercial Managed Care - HMO | Admitting: Family

## 2022-06-01 ENCOUNTER — Other Ambulatory Visit: Payer: Self-pay

## 2022-06-01 VITALS — BP 136/72 | HR 70 | Temp 97.8°F | Resp 16 | Ht 71.0 in | Wt 191.8 lb

## 2022-06-01 DIAGNOSIS — B20 Human immunodeficiency virus [HIV] disease: Secondary | ICD-10-CM

## 2022-06-01 DIAGNOSIS — Z0000 Encounter for general adult medical examination without abnormal findings: Secondary | ICD-10-CM | POA: Diagnosis not present

## 2022-06-01 DIAGNOSIS — Z720 Tobacco use: Secondary | ICD-10-CM

## 2022-06-01 DIAGNOSIS — Z113 Encounter for screening for infections with a predominantly sexual mode of transmission: Secondary | ICD-10-CM

## 2022-06-01 MED ORDER — BIKTARVY 50-200-25 MG PO TABS: 1.0000 | ORAL_TABLET | Freq: Every day | ORAL | 6 refills | Status: DC

## 2022-06-01 NOTE — Progress Notes (Signed)
Brief Narrative   Patient ID: Logan Jacobs, male    DOB: 13-Jun-2000, 22 y.o.   MRN: [id number]  Logan Jacobs is a 22 y/o AA male diagnosed with HIV in May of 2021 with risk factor of MSM. Initial CD4 nadir of 626 and viral load of 642,000. Genotype with Subtype B and no significant resistance patterns. No history of opportunistic infection. [id number] negative. Sole ART regimen of Biktarvy.    Subjective:    Chief Complaint  Patient presents with   Follow-up    B20     HPI:  Logan Jacobs is a 22 y.o. male with HIV disease last seen on 12/07/21 with well controlled virus and good adherence and tolerance to [employer]. Viral load was undetectable and CD4 count 892. STD testing was negative for gonorrhea, chlamydia and syphilis. Renal function, liver function and electrolytes within normal ranges. Here today for routine follow up.  Logan Jacobs Logan Jacobs since his last office visit with concern for loneliness during his travels which may be resulting in some underlying feelings of being down and worry at times. 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 Severity of symptoms are mild/moderate. Continues to take Biktarvy as prescribed with no adverse side effects or problems getting medications. Condoms and STD testing offered. Due for routine dental care and tetanus and [place] vaccinations.   Denies fevers, chills, night sweats, headaches, changes in vision, neck pain/stiffness, nausea, diarrhea, vomiting, lesions or rashes.   No Known Allergies    Outpatient Medications Prior to Visit  Medication Sig Dispense Refill   albuterol (PROVENTIL HFA;VENTOLIN HFA) 108 (90 Base) MCG/ACT inhaler Inhale 2 puffs into the lungs every 4 (four) hours as needed for wheezing or shortness of breath. 1 Inhaler 1   clotrimazole-betamethasone (LOTRISONE) cream Apply 1 application. topically 2 (two) times daily. 30 g 1    bictegravir-emtricitabine-tenofovir AF (BIKTARVY) 50-200-25 MG TABS tablet Take 1 tablet by mouth daily. 30 tablet 6   No facility-administered medications prior to visit.     Past Medical History:  Diagnosis Date   Asthma    HIV (human immunodeficiency virus infection)      History reviewed. No pertinent surgical history.    Review of Systems  Constitutional:  Negative for appetite change, chills, fatigue, fever and unexpected weight change.  Eyes:  Negative for visual disturbance.  Respiratory:  Negative for cough, chest tightness, shortness of breath and wheezing.   Cardiovascular:  Negative for chest pain and leg swelling.  Gastrointestinal:  Negative for abdominal pain, constipation, diarrhea, nausea and vomiting.  Genitourinary:  Negative for dysuria, flank pain, frequency, genital sores, hematuria and urgency.  Skin:  Negative for rash.  Allergic/Immunologic: Negative for immunocompromised state.  Neurological:  Negative for dizziness and headaches.      Objective:    BP 136/72   Pulse 70   Temp 97.8 F (36.6 C) (Oral)   Resp 16   Ht  (1.803 m)   Wt 191 lb 12.8 oz (87 kg)   SpO2 98%   BMI 26.75 kg/m  Nursing note and vital signs reviewed.  Physical Exam Constitutional:      General: He is not in acute distress.    Appearance: He is well-developed.  Eyes:     Conjunctiva/sclera: Conjunctivae normal.  Cardiovascular:     Rate and Rhythm: Normal rate and regular rhythm.     Heart sounds: Normal heart sounds. No murmur  heard.    No friction rub. No gallop.  Pulmonary:     Effort: Pulmonary effort is normal. No respiratory distress.     Breath sounds: Normal breath sounds. No wheezing or rales.  Chest:     Chest wall: No tenderness.  Abdominal:     General: Bowel sounds are normal.     Palpations: Abdomen is soft.     Tenderness: There is no abdominal tenderness.  Musculoskeletal:     Cervical back: Neck supple.  Lymphadenopathy:     Cervical:  No cervical adenopathy.  Skin:    General: Skin is warm and dry.     Findings: No rash.  Neurological:     Mental Status: He is alert and oriented to person, place, and time.  Psychiatric:        Behavior: Behavior normal.        Thought Content: Thought content normal.        Judgment: Judgment normal.         06/01/2022    8:45 AM 12/07/2021    9:11 AM 03/05/2021   11:48 AM 03/02/2021    3:05 PM 10/14/2020    2:21 PM  Depression screen PHQ 2/9  Decreased Interest 0 0 0 0 0  Down, Depressed, Hopeless 0 0 0  0  PHQ - 2 Score 0 0 0 0 0       Assessment & Plan:    Patient Active Problem List   Diagnosis Date Noted   Tobacco use 06/01/2022   Screening for venereal disease 03/05/2021   Healthcare maintenance 10/16/2019   Human immunodeficiency virus (HIV) disease 07/31/2019     Problem List Items Addressed This Visit       Other   Human immunodeficiency virus (HIV) disease - Primary    Logan Jacobs continues to have well controlled virus with good adherence and tolerance to [employer]. Reviewed previous lab work and discussed plan of care. Check lab work. Continue current dose of Biktarvy. Reviewed U=U and goals of maintaining undetectable status. Plan for follow up in 6 months or sooner if needed with lab work on the same day.       Relevant Medications   bictegravir-emtricitabine-tenofovir AF (BIKTARVY) 50-200-25 MG TABS tablet   Other Relevant Orders   BASIC METABOLIC PANEL WITH GFR   HIV-1 RNA quant-no reflex-bld   T-helper cell (CD4)- ([facility] clinic only)   Comprehensive metabolic panel   Healthcare maintenance    Discussed importance of safe sexual practice and condom use. Condoms and STD testing offered.  Declines vaccinations Encouraged to complete routine dental care and can refer to [facility] if needed.       Tobacco use    Logan Jacobs is a some day smoker of cigarettes and vaping. Discussed importance of cessation to reduce risk of cardiovascular, respiratory and  malignant disease in the future. In the pre-contemplation stage of change and not ready to quit at this time.       Other Visit Diagnoses     Screening for STDs (sexually transmitted diseases)       Relevant Orders   RPR   Cytology (oral, anal, urethral) ancillary only   Cytology (oral, anal, urethral) ancillary only   Urine cytology ancillary only        I am having Logan Jacobs maintain his albuterol, clotrimazole-betamethasone, and Biktarvy.   Meds ordered this encounter  Medications   bictegravir-emtricitabine-tenofovir AF (BIKTARVY) 50-200-25 MG TABS tablet    Sig: Take 1 tablet by mouth  daily.    Dispense:  30 tablet    Refill:  6    Order Specific Question:   Supervising Provider    Answer:   Judyann Munson [[id number]]     Follow-up: Return in about 6 months (around 12/01/2022), or if symptoms worsen or fail to improve.   Marcos Eke, MSN, FNP-C Nurse Practitioner [facility] for Infectious Disease [facility] Main number: [phone]

## 2022-06-01 NOTE — Assessment & Plan Note (Signed)
Logan Jacobs continues to have well controlled virus with good adherence and tolerance to [employer]. Reviewed previous lab work and discussed plan of care. Check lab work. Continue current dose of Biktarvy. Reviewed U=U and goals of maintaining undetectable status. Plan for follow up in 6 months or sooner if needed with lab work on the same day.

## 2022-06-01 NOTE — Patient Instructions (Addendum)
Nice to see you. ? ?We will check your lab work today. ? ?Continue to take your medication daily as prescribed. ? ?Refills have been sent to the pharmacy. ? ?Plan for follow up in 6 months or sooner if needed with lab work on the same day. ? ?Have a great day and stay safe! ? ?

## 2022-06-01 NOTE — Assessment & Plan Note (Signed)
Discussed importance of safe sexual practice and condom use. Condoms and STD testing offered.  Declines vaccinations Encouraged to complete routine dental care and can refer to [facility] if needed.  

## 2022-06-01 NOTE — Assessment & Plan Note (Signed)
Logan Jacobs is a some day smoker of cigarettes and vaping. Discussed importance of cessation to reduce risk of cardiovascular, respiratory and malignant disease in the future. In the pre-contemplation stage of change and not ready to quit at this time.

## 2022-06-02 LAB — URINE CYTOLOGY ANCILLARY ONLY
Chlamydia: NEGATIVE
Comment: NEGATIVE
Comment: NORMAL
Neisseria Gonorrhea: NEGATIVE

## 2022-06-02 LAB — T-HELPER CELL (CD4) - (RCID CLINIC ONLY)
CD4 % Helper T Cell: 38 % (ref 33–65)
CD4 T Cell Abs: 830 /uL (ref 400–1790)

## 2022-06-02 LAB — CYTOLOGY, (ORAL, ANAL, URETHRAL) ANCILLARY ONLY
Chlamydia: NEGATIVE
Chlamydia: NEGATIVE
Comment: NEGATIVE
Comment: NEGATIVE
Comment: NORMAL
Comment: NORMAL
Neisseria Gonorrhea: NEGATIVE
Neisseria Gonorrhea: POSITIVE — AB

## 2022-06-03 ENCOUNTER — Telehealth: Payer: Self-pay

## 2022-06-03 NOTE — Telephone Encounter (Signed)
Patient called back about lab results. Relayed to him positive results and scheduled appointment for treatment. Denies any allergies to medication. Has been treated for Gonorrhea in the past. Understands recent partners will needed to notified to get tested. Educated patient on condom use to prevent future STD exposure.  Verbalized understanding.  Juanita Laster, RMA

## 2022-06-03 NOTE — Telephone Encounter (Signed)
Unable to reach pt left vm to contact office.

## 2022-06-03 NOTE — Telephone Encounter (Signed)
-----   Message from Veryl Speak, FNP sent at 06/03/2022  9:06 AM EDT ----- Please inform Justyn that he is positive for gonorrhea. Please treat with 500 mg Ceftriaxone IM once. Thanks!

## 2022-06-04 ENCOUNTER — Other Ambulatory Visit: Payer: Self-pay

## 2022-06-04 ENCOUNTER — Ambulatory Visit (INDEPENDENT_AMBULATORY_CARE_PROVIDER_SITE_OTHER): Payer: Commercial Managed Care - HMO

## 2022-06-04 DIAGNOSIS — A549 Gonococcal infection, unspecified: Secondary | ICD-10-CM | POA: Diagnosis not present

## 2022-06-04 LAB — HIV-1 RNA QUANT-NO REFLEX-BLD
HIV 1 RNA Quant: <20 Copies/mL — ABNORMAL HIGH
HIV-1 RNA Quant, Log: <1.3 Log cps/mL — ABNORMAL HIGH

## 2022-06-04 LAB — BASIC METABOLIC PANEL WITH GFR
BUN: 12 mg/dL (ref 7–25)
CO2: 26 mmol/L (ref 20–32)
Calcium: 10.1 mg/dL (ref 8.6–10.3)
Chloride: 107 mmol/L (ref 98–110)
Creat: 0.9 mg/dL (ref 0.60–1.24)
Glucose, Bld: 102 mg/dL — ABNORMAL HIGH (ref 65–99)
Potassium: 3.9 mmol/L (ref 3.5–5.3)
Sodium: 142 mmol/L (ref 135–146)
eGFR: 123 mL/min/{1.73_m2} (ref >=60)

## 2022-06-04 LAB — RPR: RPR Ser Ql: NONREACTIVE

## 2022-06-04 MED ORDER — CEFTRIAXONE SODIUM 500 MG IJ SOLR
500.0000 mg | Freq: Once | INTRAMUSCULAR | Status: AC
  Administered 2022-06-04: 500 mg via INTRAMUSCULAR

## 2022-06-04 NOTE — Progress Notes (Signed)
Administered ceftriaxone 500 mg. Patient tolerated injection well. Informed patient to have no sexual activity for 7 days and let partner know so that they can be tested and treated as well.

## 2022-10-13 ENCOUNTER — Other Ambulatory Visit: Payer: Self-pay | Admitting: Family

## 2022-10-13 DIAGNOSIS — B20 Human immunodeficiency virus [HIV] disease: Secondary | ICD-10-CM

## 2022-12-07 ENCOUNTER — Ambulatory Visit: Payer: Managed Care, Other (non HMO) | Admitting: Family

## 2022-12-15 ENCOUNTER — Encounter: Payer: Self-pay | Admitting: Family

## 2022-12-15 ENCOUNTER — Other Ambulatory Visit (HOSPITAL_COMMUNITY)
Admission: RE | Admit: 2022-12-15 | Discharge: 2022-12-15 | Disposition: A | Discharge status: Home / Self Care | Payer: Managed Care, Other (non HMO) | Source: Ambulatory Visit | Attending: Family | Admitting: Family

## 2022-12-15 ENCOUNTER — Other Ambulatory Visit: Payer: Self-pay

## 2022-12-15 ENCOUNTER — Ambulatory Visit (INDEPENDENT_AMBULATORY_CARE_PROVIDER_SITE_OTHER): Payer: Managed Care, Other (non HMO) | Admitting: Family

## 2022-12-15 VITALS — BP 135/85 | HR 78 | Resp 16 | Ht 71.0 in | Wt 181.0 lb

## 2022-12-15 DIAGNOSIS — Z113 Encounter for screening for infections with a predominantly sexual mode of transmission: Secondary | ICD-10-CM

## 2022-12-15 DIAGNOSIS — R4589 Other symptoms and signs involving emotional state: Secondary | ICD-10-CM | POA: Insufficient documentation

## 2022-12-15 DIAGNOSIS — Z23 Encounter for immunization: Secondary | ICD-10-CM | POA: Diagnosis not present

## 2022-12-15 DIAGNOSIS — Z0000 Encounter for general adult medical examination without abnormal findings: Secondary | ICD-10-CM | POA: Diagnosis not present

## 2022-12-15 DIAGNOSIS — B20 Human immunodeficiency virus [HIV] disease: Secondary | ICD-10-CM

## 2022-12-15 MED ORDER — BIKTARVY 50-200-25 MG PO TABS: 1.0000 | ORAL_TABLET | Freq: Every day | ORAL | 6 refills | Status: DC

## 2022-12-15 NOTE — Progress Notes (Signed)
Brief Narrative   Patient ID: Logan Jacobs, male    DOB: 08-28-2000, 22 y.o.   MRN: [id number]  Logan Jacobs is a 22 y/o AA male diagnosed with HIV in May of 2021 with risk factor of MSM. Initial CD4 nadir of 626 and viral load of 642,000. Genotype with Subtype B and no significant resistance patterns. No history of opportunistic infection. [id number] negative. Sole ART regimen of Biktarvy.    Subjective:    Chief Complaint  Patient presents with   Follow-up    HPI:  Logan Jacobs is a 22 y.o. male with HIV disease last seen on 4/23 with 24 with well-controlled virus and good adherence and tolerance to [employer].  Viral load was undetectable and CD4 count 830.  Kidney function and electrolytes within normal ranges.  STD testing was positive for gonorrhea which was treated with 500 mg ceftriaxone IM once.  Here today for routine follow-up.  Logan Jacobs has been doing well since his last office visit and continues to take [place] as prescribed with no adverse side effects or problems obtaining medication from the pharmacy.  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 This has been going on for about 1 month now.  Did have suicidal ideations at 1 point with no plan and has since talked to friends and family and now currently having a "good week".  Mood has been more stable.  Denies auditory or visual hallucinations.  Condoms and STD testing offered.  Healthcare maintenance reviewed.   Denies fevers, chills, night sweats, headaches, changes in vision, neck pain/stiffness, nausea, diarrhea, vomiting, lesions or rashes.  Lab Results  Component Value Date   CD4TCELL 38 06/01/2022   CD4TABS 830 06/01/2022   Lab Results  Component Value Date   HIV1RNAQUANT <20 (H) 06/01/2022     No Known Allergies    Outpatient Medications Prior to Visit   Medication Sig Dispense Refill   albuterol (PROVENTIL HFA;VENTOLIN HFA) 108 (90 Base) MCG/ACT inhaler Inhale 2 puffs into the lungs every 4 (four) hours as needed for wheezing or shortness of breath. 1 Inhaler 1   clotrimazole-betamethasone (LOTRISONE) cream Apply 1 application. topically 2 (two) times daily. 30 g 1   amoxicillin-clavulanate (AUGMENTIN) 875-125 MG tablet Take 1 tablet by mouth 2 (two) times daily.     BIKTARVY 50-200-25 MG TABS tablet TAKE 1 TABLET BY MOUTH DAILY 30 tablet 6   No facility-administered medications prior to visit.     Past Medical History:  Diagnosis Date   Asthma    HIV (human immunodeficiency virus infection) (HCC)      History reviewed. No pertinent surgical history.    Review of Systems  Constitutional:  Negative for appetite change, chills, fatigue, fever and unexpected weight change.  Eyes:  Negative for visual disturbance.  Respiratory:  Negative for cough, chest tightness, shortness of breath and wheezing.   Cardiovascular:  Negative for chest pain and leg swelling.  Gastrointestinal:  Negative for abdominal pain, constipation, diarrhea, nausea and vomiting.  Genitourinary:  Negative for dysuria, flank pain, frequency, genital sores, hematuria and urgency.  Skin:  Negative for rash.  Allergic/Immunologic: Negative for immunocompromised state.  Neurological:  Negative for dizziness and headaches.  Psychiatric/Behavioral:  Positive for dysphoric mood.       Objective:    BP 135/85   Pulse 78   Resp 16  Ht 5\' 11"  (1.803 m)   Wt 181 lb (82.1 kg)   BMI 25.24 kg/m  Nursing note and vital signs reviewed.  Physical Exam Constitutional:      General: He is not in acute distress.    Appearance: He is well-developed.  Eyes:     Conjunctiva/sclera: Conjunctivae normal.  Cardiovascular:     Rate and Rhythm: Normal rate and regular rhythm.     Heart sounds: Normal heart sounds. No murmur heard.    No friction rub. No gallop.   Pulmonary:     Effort: Pulmonary effort is normal. No respiratory distress.     Breath sounds: Normal breath sounds. No wheezing or rales.  Chest:     Chest wall: No tenderness.  Abdominal:     General: Bowel sounds are normal.     Palpations: Abdomen is soft.     Tenderness: There is no abdominal tenderness.  Musculoskeletal:     Cervical back: Neck supple.  Lymphadenopathy:     Cervical: No cervical adenopathy.  Skin:    General: Skin is warm and dry.     Findings: No rash.  Neurological:     Mental Status: He is alert and oriented to person, place, and time.  Psychiatric:        Behavior: Behavior normal.        Thought Content: Thought content normal.        Judgment: Judgment normal.         12/15/2022    3:40 PM 06/01/2022    8:45 AM 12/07/2021    9:11 AM 03/05/2021   11:48 AM 03/02/2021    3:05 PM  Depression screen PHQ 2/9  Decreased Interest 0 0 0 0 0  Down, Depressed, Hopeless 0 0 0 0   PHQ - 2 Score 0 0 0 0 0       Assessment & Plan:    Patient Active Problem List   Diagnosis Date Noted   Dysphoric mood 12/15/2022   Tobacco use 06/01/2022   Screening for venereal disease 03/05/2021   Healthcare maintenance 10/16/2019   Human immunodeficiency virus (HIV) disease (HCC) 07/31/2019     Problem List Items Addressed This Visit       Other   Human immunodeficiency virus (HIV) disease (HCC) - Primary    Logan Jacobs continues to have well-controlled virus with good adherence and tolerance to Biktarvy.  Reviewed previous lab work and discussed plan of care and U equals U.  Check blood work.  Continue current dose of Biktarvy.  Plan for follow-up in 6 months or sooner if needed with lab work on the same day.      Relevant Medications   bictegravir-emtricitabine-tenofovir AF (BIKTARVY) 50-200-25 MG TABS tablet   Other Relevant Orders   COMPLETE METABOLIC PANEL WITH GFR   HIV-1 RNA quant-no reflex-bld   T-helper cell (CD4)- ([facility] clinic only)   Healthcare  maintenance    Discussed importance of safe sexual practice and condom use. Condoms and STD testing offered.  Vaccinations reviewed - influenza updated.       Dysphoric mood    Logan Jacobs has an acute onset dysphoric mood likely associated with recent change with return to school and new stressors. Mood today is improved after speaking with close family and friends. Did have thoughts of harming himself at one point with no plan but not currently. Primary symptom is lack of motivation/drive. Suspect this is situational stress as opposed to underlying depression and recommend starting  with counseling prior to initiation of any medication. Counseling resources provided. If medication need would consider starting with Fluoxetine. He will continue to keep [place] updated and follow up as needed.       Other Visit Diagnoses     Screening for STDs (sexually transmitted diseases)       Relevant Orders   RPR   Urine cytology ancillary only   Cytology (oral, anal, urethral) ancillary only   Cytology (oral, anal, urethral) ancillary only   Encounter for immunization       Relevant Orders   Flu vaccine trivalent PF, 6mos and older(Flulaval,Afluria,Fluarix,Fluzone) (Completed)        I have discontinued Logan Jacobs amoxicillin-clavulanate. I have also changed his Biktarvy. Additionally, I am having him maintain his albuterol and clotrimazole-betamethasone.   Meds ordered this encounter  Medications   bictegravir-emtricitabine-tenofovir AF (BIKTARVY) 50-200-25 MG TABS tablet    Sig: Take 1 tablet by mouth daily.    Dispense:  30 tablet    Refill:  6    Order Specific Question:   Supervising Provider    Answer:   Judyann Munson [[id number]]     Follow-up: Return in about 6 months (around 06/14/2023), or if symptoms worsen or fail to improve. or sooner if needed.    Marcos Eke, MSN, FNP-C Nurse Practitioner [facility] for Infectious Disease [facility] Main number:  [phone]

## 2022-12-15 NOTE — Assessment & Plan Note (Signed)
Logan Jacobs continues to have well-controlled virus with good adherence and tolerance to [employer].  Reviewed previous lab work and discussed plan of care and U equals U.  Check blood work.  Continue current dose of Biktarvy.  Plan for follow-up in 6 months or sooner if needed with lab work on the same day.

## 2022-12-15 NOTE — Assessment & Plan Note (Signed)
Discussed importance of safe sexual practice and condom use. Condoms and STD testing offered.  Vaccinations reviewed - influenza updated.

## 2022-12-15 NOTE — Assessment & Plan Note (Signed)
Logan Jacobs has an acute onset dysphoric mood likely associated with recent change with return to school and new stressors. Mood today is improved after speaking with close family and friends. Did have thoughts of harming himself at one point with no plan but not currently. Primary symptom is lack of motivation/drive. Suspect this is situational stress as opposed to underlying depression and recommend starting with counseling prior to initiation of any medication. Counseling resources provided. If medication need would consider starting with Fluoxetine. He will continue to keep [place] updated and follow up as needed.

## 2022-12-15 NOTE — Patient Instructions (Addendum)
Nice to see you.  We will check your lab work today.  Continue to take your medication daily as prescribed.  Refills have been sent to the pharmacy.  Plan for follow up in 6 months or sooner if needed with lab work on the same day.  [facility] [phone] Crisis Line [phone]   Have a great day and stay safe!

## 2022-12-16 LAB — T-HELPER CELL (CD4) - (RCID CLINIC ONLY)
CD4 % Helper T Cell: 35 % (ref 33–65)
CD4 T Cell Abs: 937 /uL (ref 400–1790)

## 2022-12-17 LAB — CYTOLOGY, (ORAL, ANAL, URETHRAL) ANCILLARY ONLY
Chlamydia: NEGATIVE
Chlamydia: NEGATIVE
Comment: NEGATIVE
Comment: NEGATIVE
Comment: NORMAL
Comment: NORMAL
Neisseria Gonorrhea: NEGATIVE
Neisseria Gonorrhea: NEGATIVE

## 2022-12-17 LAB — URINE CYTOLOGY ANCILLARY ONLY
Chlamydia: NEGATIVE
Comment: NEGATIVE
Comment: NORMAL
Neisseria Gonorrhea: NEGATIVE

## 2022-12-18 LAB — COMPLETE METABOLIC PANEL WITH GFR
AG Ratio: 1.8 (calc) (ref 1.0–2.5)
ALT: 14 U/L (ref 9–46)
AST: 17 U/L (ref 10–40)
Albumin: 4.6 g/dL (ref 3.6–5.1)
Alkaline phosphatase (APISO): 68 U/L (ref 36–130)
BUN: 9 mg/dL (ref 7–25)
CO2: 26 mmol/L (ref 20–32)
Calcium: 9.9 mg/dL (ref 8.6–10.3)
Chloride: 105 mmol/L (ref 98–110)
Creat: 0.93 mg/dL (ref 0.60–1.24)
Globulin: 2.6 g/dL (ref 1.9–3.7)
Glucose, Bld: 92 mg/dL (ref 65–99)
Potassium: 3.9 mmol/L (ref 3.5–5.3)
Sodium: 140 mmol/L (ref 135–146)
Total Bilirubin: 0.5 mg/dL (ref 0.2–1.2)
Total Protein: 7.2 g/dL (ref 6.1–8.1)
eGFR: 118 mL/min/{1.73_m2} (ref >=60)

## 2022-12-18 LAB — HIV-1 RNA QUANT-NO REFLEX-BLD
HIV 1 RNA Quant: NOT DETECTED {copies}/mL
HIV-1 RNA Quant, Log: NOT DETECTED {Log_copies}/mL

## 2022-12-18 LAB — RPR: RPR Ser Ql: NONREACTIVE

## 2022-12-23 IMAGING — DX DG FOOT COMPLETE 3+V*L*
3 series · 3 of 3 positions shown · non-contrast
Comparison: None.

CLINICAL DATA: Left foot pain

EXAM:
LEFT FOOT - COMPLETE 3+ VIEW

[foot ap]
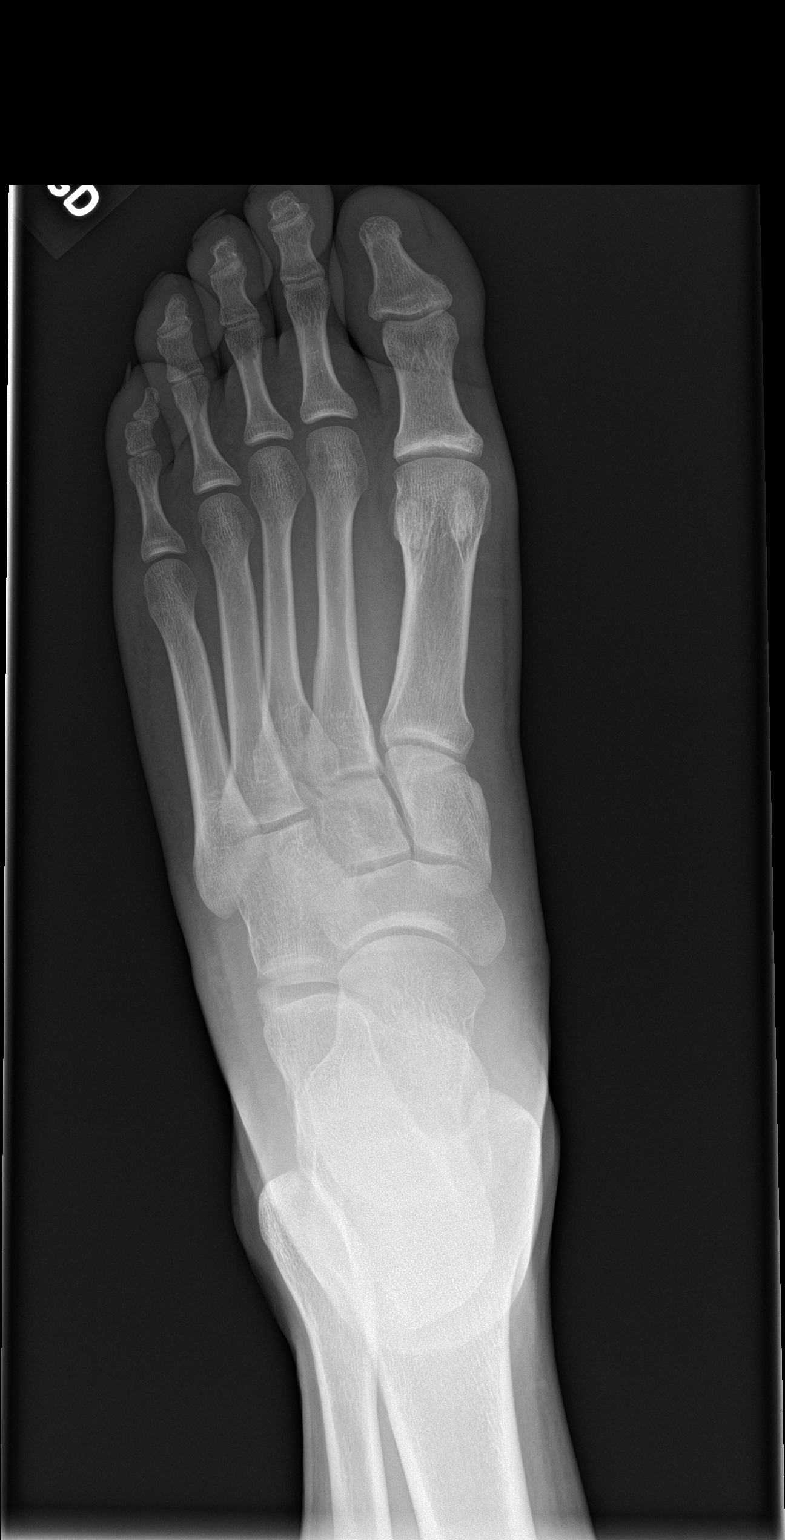

[foot obl]
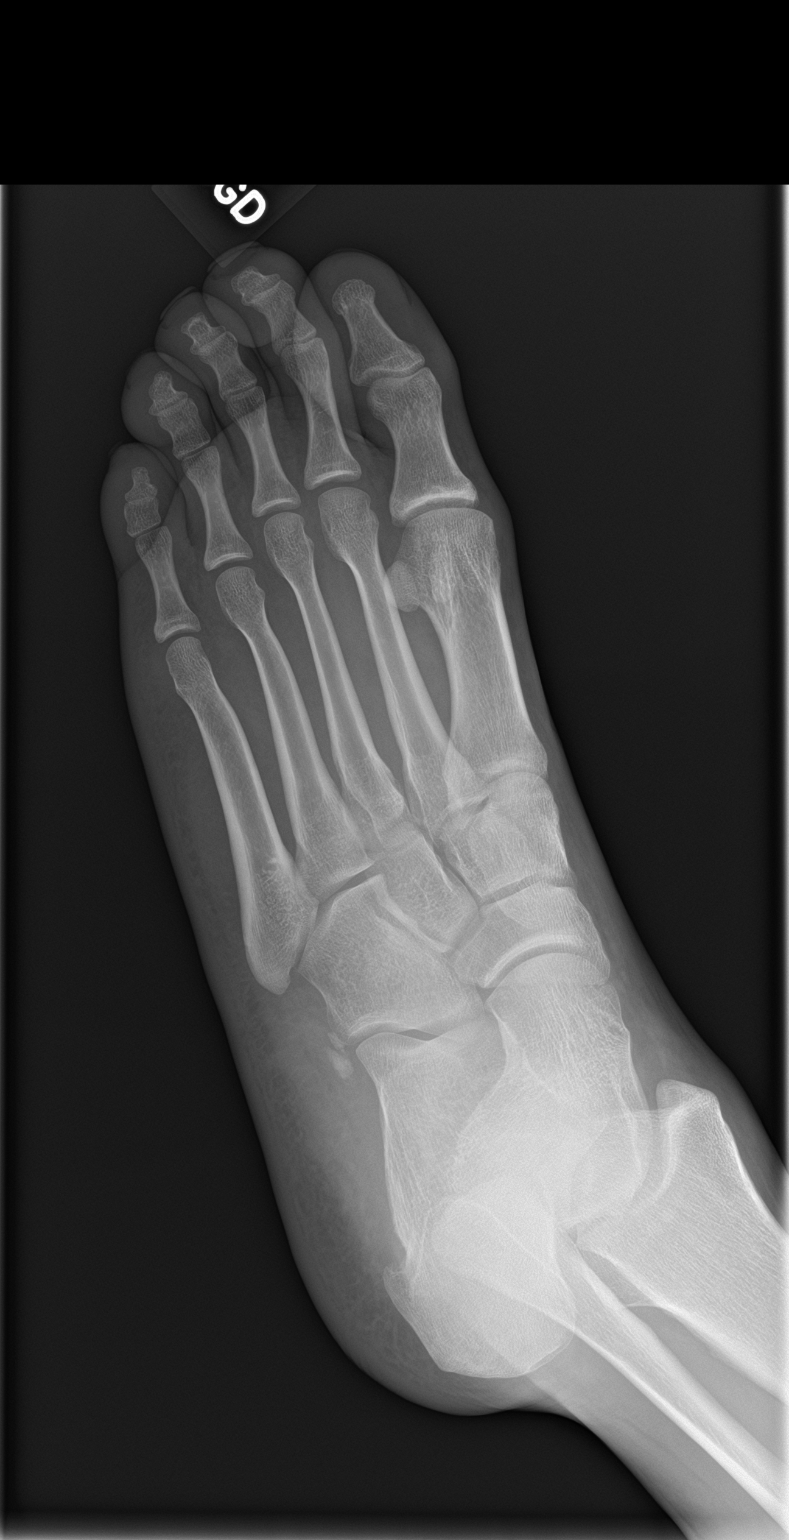

[foot lat]
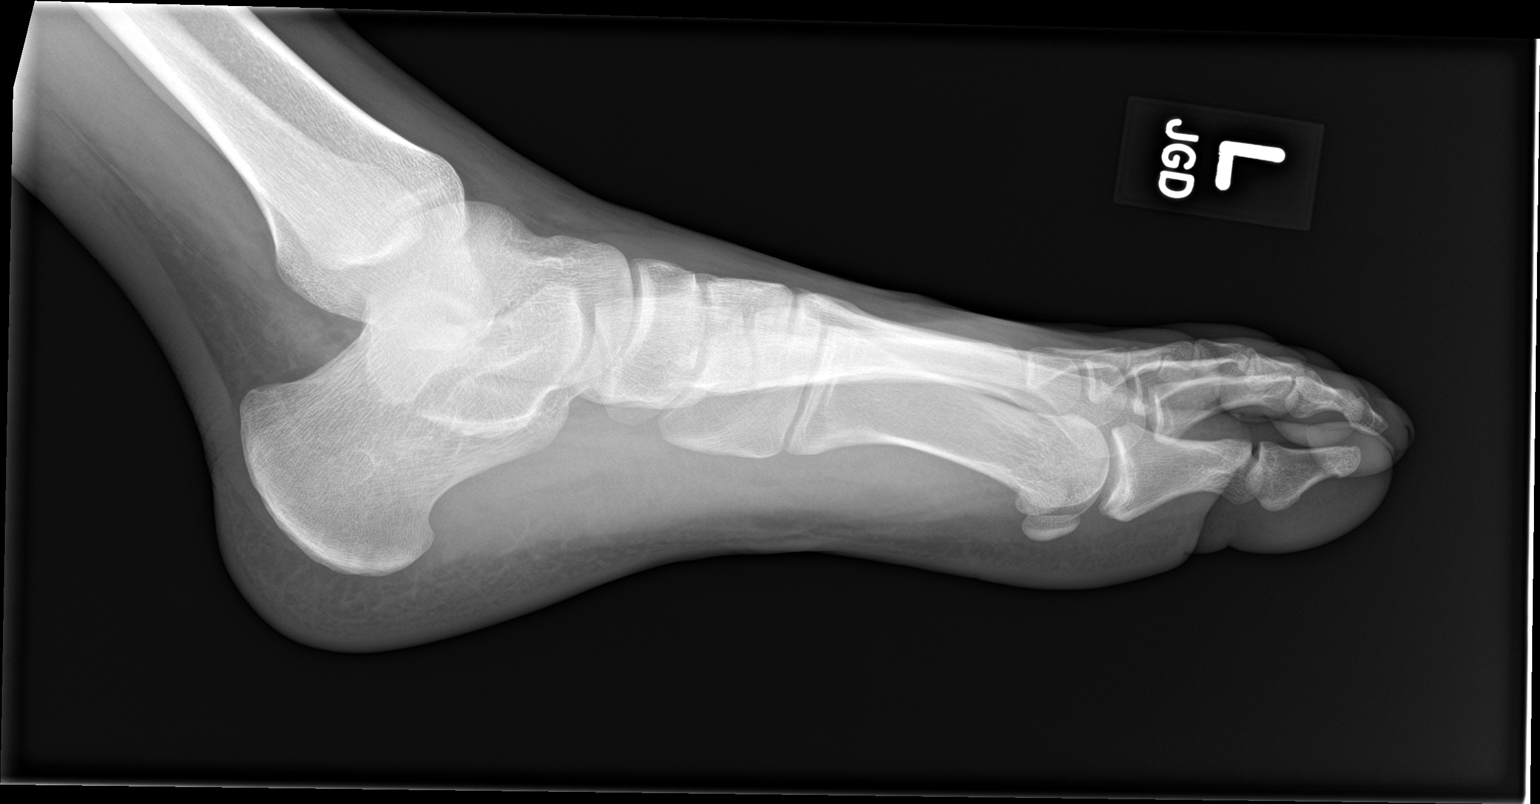

[3 of 3 positions shown; findings below may reference images not displayed]

FINDINGS: There is no evidence of fracture or dislocation. No significant
degenerative change. Os peroneum. Soft tissues are unremarkable.
IMPRESSION: Negative left foot radiographs.

## 2023-06-21 ENCOUNTER — Ambulatory Visit: Payer: Managed Care, Other (non HMO) | Admitting: Family

## 2023-07-13 ENCOUNTER — Encounter: Payer: Self-pay | Admitting: Family

## 2023-07-13 ENCOUNTER — Other Ambulatory Visit (HOSPITAL_COMMUNITY)
Admission: RE | Admit: 2023-07-13 | Discharge: 2023-07-13 | Disposition: A | Discharge status: Home / Self Care | Source: Ambulatory Visit | Attending: Family | Admitting: Family

## 2023-07-13 ENCOUNTER — Other Ambulatory Visit: Payer: Self-pay

## 2023-07-13 ENCOUNTER — Ambulatory Visit: Admitting: Family

## 2023-07-13 VITALS — BP 135/80 | HR 70 | Temp 97.8°F | Ht 71.0 in | Wt 174.0 lb

## 2023-07-13 DIAGNOSIS — B20 Human immunodeficiency virus [HIV] disease: Secondary | ICD-10-CM | POA: Diagnosis not present

## 2023-07-13 DIAGNOSIS — Z0000 Encounter for general adult medical examination without abnormal findings: Secondary | ICD-10-CM

## 2023-07-13 DIAGNOSIS — Z113 Encounter for screening for infections with a predominantly sexual mode of transmission: Secondary | ICD-10-CM | POA: Insufficient documentation

## 2023-07-13 DIAGNOSIS — Z720 Tobacco use: Secondary | ICD-10-CM | POA: Diagnosis not present

## 2023-07-13 MED ORDER — BIKTARVY 50-200-25 MG PO TABS: 1.0000 | ORAL_TABLET | Freq: Every day | ORAL | 6 refills | Status: AC

## 2023-07-13 NOTE — Progress Notes (Signed)
 Brief Narrative   Patient ID: Logan Jacobs, male    DOB: Jul 24, 2000, 23 y.o.   MRN: [id number]  Logan Jacobs is a 23 y/o AA male diagnosed with HIV in May of 2021 with risk factor of MSM. Initial CD4 nadir of 626 and viral load of 642,000. Genotype with Subtype B and no significant resistance patterns. No history of opportunistic infection. [id number] negative. Sole ART regimen of Biktarvy .    Subjective:   Chief Complaint  Patient presents with   Follow-up    B20    HPI:  Logan Jacobs is a 23 y.o. male with HIV disease last seen on 12/15/22 with well controlled virus and good adherence and tolerance to Biktarvy . Viral load was undetectable and CD4 count 937.  Kidney function, liver function, electrolytes within normal ranges.  STD testing was negative for gonorrhea, chlamydia, and syphilis.  Here today for routine follow-up.  Logan Jacobs has been doing well since his last office visit and continues to take Biktarvy  as prescribed with no adverse side effects or problems obtaining medication from the pharmacy.  Does have new insurance through [employer].  No new concerns/complaints.  Continues to work and study.  Housing, access to food, and transportation are stable.  Healthcare maintenance reviewed.  Condoms and site-specific STD testing offered.  Use electronic cigarette/vaping on some days.  Due for routine dental care.  Denies fevers, chills, night sweats, headaches, changes in vision, neck pain/stiffness, nausea, diarrhea, vomiting, lesions or rashes.  Lab Results  Component Value Date   CD4TCELL 35 12/15/2022   CD4TABS 937 12/15/2022   Lab Results  Component Value Date   HIV1RNAQUANT Not Detected 12/15/2022     No Known Allergies    Outpatient Medications Prior to Visit  Medication Sig Dispense Refill   albuterol  (PROVENTIL  HFA;VENTOLIN  HFA) 108 (90 Base) MCG/ACT inhaler Inhale 2 puffs into the lungs every 4 (four) hours as needed for wheezing or shortness of breath. 1  Inhaler 1   bictegravir-emtricitabine-tenofovir AF (BIKTARVY ) 50-200-25 MG TABS tablet Take 1 tablet by mouth daily. 30 tablet 6   clotrimazole -betamethasone  (LOTRISONE ) cream Apply 1 application. topically 2 (two) times daily. (Patient not taking: Reported on 07/13/2023) 30 g 1   No facility-administered medications prior to visit.     Past Medical History:  Diagnosis Date   Asthma    HIV (human immunodeficiency virus infection) (HCC)      History reviewed. No pertinent surgical history.      Review of Systems  Constitutional:  Negative for appetite change, chills, fatigue, fever and unexpected weight change.  Eyes:  Negative for visual disturbance.  Respiratory:  Negative for cough, chest tightness, shortness of breath and wheezing.   Cardiovascular:  Negative for chest pain and leg swelling.  Gastrointestinal:  Negative for abdominal pain, constipation, diarrhea, nausea and vomiting.  Genitourinary:  Negative for dysuria, flank pain, frequency, genital sores, hematuria and urgency.  Skin:  Negative for rash.  Allergic/Immunologic: Negative for immunocompromised state.  Neurological:  Negative for dizziness and headaches.     Objective:   BP 135/80   Pulse 70   Temp 97.8 F (36.6 C) (Temporal)   Ht 5\' 11"  (1.803 m)   Wt 174 lb (78.9 kg)   SpO2 99%   BMI 24.27 kg/m  Nursing note and vital signs reviewed.  Physical Exam Constitutional:      General: He is not in acute distress.    Appearance: He is well-developed.  Eyes:     Conjunctiva/sclera:  Conjunctivae normal.  Cardiovascular:     Rate and Rhythm: Normal rate and regular rhythm.     Heart sounds: Normal heart sounds. No murmur heard.    No friction rub. No gallop.  Pulmonary:     Effort: Pulmonary effort is normal. No respiratory distress.     Breath sounds: Normal breath sounds. No wheezing or rales.  Chest:     Chest wall: No tenderness.  Abdominal:     General: Bowel sounds are normal.      Palpations: Abdomen is soft.     Tenderness: There is no abdominal tenderness.  Musculoskeletal:     Cervical back: Neck supple.  Lymphadenopathy:     Cervical: No cervical adenopathy.  Skin:    General: Skin is warm and dry.     Findings: No rash.  Neurological:     Mental Status: He is alert and oriented to person, place, and time.  Psychiatric:        Behavior: Behavior normal.        Thought Content: Thought content normal.        Judgment: Judgment normal.          12/15/2022    3:40 PM 06/01/2022    8:45 AM 12/07/2021    9:11 AM 03/05/2021   11:48 AM 03/02/2021    3:05 PM  Depression screen PHQ 2/9  Decreased Interest 0 0 0 0 0  Down, Depressed, Hopeless 0 0 0 0   PHQ - 2 Score 0 0 0 0 0         No data to display           The ASCVD Risk score (Arnett DK, et al., 2019) failed to calculate for the following reasons:   The 2019 ASCVD risk score is only valid for ages 17 to 71      Assessment & Plan:    Patient Active Problem List   Diagnosis Date Noted   Dysphoric mood 12/15/2022   Tobacco use 06/01/2022   Screening for venereal disease 03/05/2021   Healthcare maintenance 10/16/2019   Human immunodeficiency virus (HIV) disease (HCC) 07/31/2019     Problem List Items Addressed This Visit       Other   Human immunodeficiency virus (HIV) disease (HCC) - Primary   Logan Jacobs continues to have well controlled virus with good adherence and tolerance to Biktarvy .  Reviewed lab work and discussed plan of care, U equals U, and family planning. Social determinants of health reviewed and no interventions indicated. Check lab work. Continue current dose of Biktarvy . Plan for follow up in  6 months or sooner if needed with lab work on the same day..       Relevant Medications   bictegravir-emtricitabine-tenofovir AF (BIKTARVY ) 50-200-25 MG TABS tablet   Other Relevant Orders   Comprehensive metabolic panel with GFR   HIV-1 RNA quant-no reflex-bld   T-helper  cell (CD4)- ([facility] clinic only)   Healthcare maintenance   Discussed importance of safe sexual practice and condom use. Condoms and site specific STD testing offered.  Vaccinations reviewed and declined following counseling. Due for dental care which he will schedule independently.      Tobacco use   Logan Jacobs vapes some days. Counseled on the dangers of tobacco not ready to quit at this time.  Reviewed strategies to maximize success, including removing cigarettes and smoking materials from environment, stress management, substitution of other forms of reinforcement, support of family/friends, and written materials.  Other Visit Diagnoses       Screening for STDs (sexually transmitted diseases)       Relevant Orders   RPR   Urine cytology ancillary only   Cytology (oral, anal, urethral) ancillary only   Cytology (oral, anal, urethral) ancillary only        I have discontinued Logan Jacobs's clotrimazole -betamethasone . I am also having him maintain his albuterol  and Biktarvy .   Meds ordered this encounter  Medications   bictegravir-emtricitabine-tenofovir AF (BIKTARVY ) 50-200-25 MG TABS tablet    Sig: Take 1 tablet by mouth daily.    Dispense:  30 tablet    Refill:  6    Supervising Provider:   Liane Redman [phone]    Prescription Type::   Renewal     Follow-up: Return in about 6 months (around 01/12/2024). or sooner if needed.    Logan Silva, MSN, FNP-C Nurse Practitioner [facility] Main number: [phone]

## 2023-07-13 NOTE — Assessment & Plan Note (Signed)
 [name] continues to have well controlled virus with good adherence and tolerance to Biktarvy .  Reviewed lab work and discussed plan of care, U equals U, and family planning. Social determinants of health reviewed and no interventions indicated. Check lab work. Continue current dose of Biktarvy . Plan for follow up in  6 months or sooner if needed with lab work on the same day.Logan Jacobs

## 2023-07-13 NOTE — Assessment & Plan Note (Signed)
 Mr. Stepanek vapes some days. Counseled on the dangers of tobacco not ready to quit at this time.  Reviewed strategies to maximize success, including removing cigarettes and smoking materials from environment, stress management, substitution of other forms of reinforcement, support of family/friends, and written materials.

## 2023-07-13 NOTE — Assessment & Plan Note (Signed)
 Discussed importance of safe sexual practice and condom use. Condoms and site specific STD testing offered.  Vaccinations reviewed and declined following counseling. Due for dental care which he will schedule independently.

## 2023-07-13 NOTE — Patient Instructions (Addendum)
 Nice to see you.  We will check your lab work today.  Continue to take your medication daily as prescribed.  Refills have been sent to the pharmacy.  Plan for follow up in 6 months or sooner if needed with lab work on the same day.  Have a great day and stay safe!   Smoking Cessation: QuitlineNC 1-800-QUIT-NOW (775)012-8931); Espaol: 1-855-Djelo-Ya (1-(905)002-5895) http://carroll-castaneda.info/

## 2023-07-14 LAB — URINE CYTOLOGY ANCILLARY ONLY
Chlamydia: NEGATIVE
Comment: NEGATIVE
Comment: NORMAL
Neisseria Gonorrhea: NEGATIVE

## 2023-07-14 LAB — CYTOLOGY, (ORAL, ANAL, URETHRAL) ANCILLARY ONLY
Chlamydia: NEGATIVE
Chlamydia: NEGATIVE
Comment: NEGATIVE
Comment: NEGATIVE
Comment: NORMAL
Comment: NORMAL
Neisseria Gonorrhea: NEGATIVE
Neisseria Gonorrhea: NEGATIVE

## 2023-07-14 LAB — T-HELPER CELL (CD4) - (RCID CLINIC ONLY)
CD4 % Helper T Cell: 38 % (ref 33–65)
CD4 T Cell Abs: 680 /uL (ref 400–1790)

## 2023-07-15 LAB — COMPREHENSIVE METABOLIC PANEL WITH GFR
AG Ratio: 1.5 (calc) (ref 1.0–2.5)
ALT: 20 U/L (ref 9–46)
AST: 22 U/L (ref 10–40)
Albumin: 4.4 g/dL (ref 3.6–5.1)
Alkaline phosphatase (APISO): 60 U/L (ref 36–130)
BUN: 9 mg/dL (ref 7–25)
CO2: 29 mmol/L (ref 20–32)
Calcium: 10 mg/dL (ref 8.6–10.3)
Chloride: 105 mmol/L (ref 98–110)
Creat: 1.01 mg/dL (ref 0.60–1.24)
Globulin: 3 g/dL (ref 1.9–3.7)
Glucose, Bld: 90 mg/dL (ref 65–99)
Potassium: 4.1 mmol/L (ref 3.5–5.3)
Sodium: 141 mmol/L (ref 135–146)
Total Bilirubin: 0.7 mg/dL (ref 0.2–1.2)
Total Protein: 7.4 g/dL (ref 6.1–8.1)
eGFR: 107 mL/min/{1.73_m2} (ref >=60)

## 2023-07-15 LAB — RPR: RPR Ser Ql: NONREACTIVE

## 2023-07-15 LAB — HIV-1 RNA QUANT-NO REFLEX-BLD
HIV 1 RNA Quant: NOT DETECTED {copies}/mL
HIV-1 RNA Quant, Log: NOT DETECTED {Log_copies}/mL

## 2023-07-19 ENCOUNTER — Ambulatory Visit: Payer: Self-pay | Admitting: Family
